# Patient Record
Sex: Female | Born: 1980
Health system: Southern US, Community
[De-identification: ages and names within clinical notes are randomized; demographics above are authoritative.]

## PROBLEM LIST (undated history)

## (undated) DIAGNOSIS — I10 Essential (primary) hypertension: Secondary | ICD-10-CM

## (undated) DIAGNOSIS — IMO0002 Reserved for concepts with insufficient information to code with codable children: Secondary | ICD-10-CM

## (undated) DIAGNOSIS — G5603 Carpal tunnel syndrome, bilateral upper limbs: Secondary | ICD-10-CM

## (undated) DIAGNOSIS — Z9889 Other specified postprocedural states: Secondary | ICD-10-CM

## (undated) DIAGNOSIS — R112 Nausea with vomiting, unspecified: Secondary | ICD-10-CM

## (undated) HISTORY — PX: OTHER SURGICAL HISTORY: SHX169

## (undated) HISTORY — PX: HIP SURGERY: SHX245

---

## 2008-11-22 ENCOUNTER — Emergency Department (HOSPITAL_BASED_OUTPATIENT_CLINIC_OR_DEPARTMENT_OTHER): Admission: EM | Admit: 2008-11-22 | Discharge: 2008-11-22 | Payer: Self-pay | Admitting: Emergency Medicine

## 2009-03-24 ENCOUNTER — Emergency Department (HOSPITAL_BASED_OUTPATIENT_CLINIC_OR_DEPARTMENT_OTHER): Admission: EM | Admit: 2009-03-24 | Discharge: 2009-03-24 | Payer: Self-pay | Admitting: Emergency Medicine

## 2009-05-13 ENCOUNTER — Emergency Department (HOSPITAL_BASED_OUTPATIENT_CLINIC_OR_DEPARTMENT_OTHER): Admission: EM | Admit: 2009-05-13 | Discharge: 2009-05-13 | Payer: Self-pay | Admitting: Emergency Medicine

## 2010-04-18 LAB — PREGNANCY, URINE: Preg Test, Ur: POSITIVE

## 2010-04-18 LAB — URINALYSIS, ROUTINE W REFLEX MICROSCOPIC
Nitrite: NEGATIVE
Protein, ur: NEGATIVE mg/dL
Urobilinogen, UA: 0.2 mg/dL (ref 0.0–1.0)

## 2010-04-18 LAB — URINE MICROSCOPIC-ADD ON

## 2010-08-24 ENCOUNTER — Emergency Department (HOSPITAL_BASED_OUTPATIENT_CLINIC_OR_DEPARTMENT_OTHER)
Admission: EM | Admit: 2010-08-24 | Discharge: 2010-08-24 | Disposition: A | Discharge status: Home / Self Care | Payer: Medicaid Other | Attending: Emergency Medicine | Admitting: Emergency Medicine

## 2010-08-24 DIAGNOSIS — I10 Essential (primary) hypertension: Secondary | ICD-10-CM | POA: Insufficient documentation

## 2010-08-24 DIAGNOSIS — J029 Acute pharyngitis, unspecified: Secondary | ICD-10-CM | POA: Insufficient documentation

## 2010-08-24 DIAGNOSIS — R059 Cough, unspecified: Secondary | ICD-10-CM | POA: Insufficient documentation

## 2010-08-24 DIAGNOSIS — R05 Cough: Secondary | ICD-10-CM | POA: Insufficient documentation

## 2010-08-24 DIAGNOSIS — J069 Acute upper respiratory infection, unspecified: Secondary | ICD-10-CM | POA: Insufficient documentation

## 2010-08-24 HISTORY — DX: Essential (primary) hypertension: I10

## 2010-08-24 LAB — RAPID STREP SCREEN (MED CTR MEBANE ONLY): Streptococcus, Group A Screen (Direct): NEGATIVE

## 2010-08-24 MED ORDER — FEXOFENADINE HCL 180 MG PO TABS: 180.0000 mg | ORAL_TABLET | Freq: Every day | ORAL | Status: DC

## 2010-08-24 MED ORDER — IBUPROFEN 800 MG PO TABS
800.0000 mg | ORAL_TABLET | Freq: Once | ORAL | Status: AC
  Administered 2010-08-24: 800 mg via ORAL
  Filled 2010-08-24: qty 1

## 2010-08-24 MED ORDER — PROMETHAZINE-DM 6.25-15 MG/5ML PO SYRP: ORAL_SOLUTION | ORAL | Status: DC

## 2010-08-24 NOTE — ED Provider Notes (Signed)
History     CSN: [id number] Arrival date & time: 08/24/2010  3:44 PM  Chief Complaint  Patient presents with  . Cough  . Sore Throat   Patient is a 30 y.o. female presenting with cough and pharyngitis. The history is provided by the patient.  Cough This is a new problem. The current episode started more than 2 days ago. The problem occurs constantly. The problem has been gradually worsening. The cough is productive of sputum. The maximum temperature recorded prior to her arrival was 100 to 100.9 F. Pertinent negatives include no chest pain, no shortness of breath and no wheezing. She is a smoker. Her past medical history is significant for bronchitis and pneumonia. Her past medical history does not include COPD, emphysema or asthma.  Sore Throat Associated symptoms include coughing. Pertinent negatives include no abdominal pain, arthralgias, chest pain, fever, neck pain or rash.    Past Medical History  Diagnosis Date  . Hypertension     Past Surgical History  Procedure Date  . Cesarean section     History reviewed. No pertinent family history.  History  Substance Use Topics  . Smoking status: Current Everyday Smoker -- 0.5 packs/day  . Smokeless tobacco: Not on file  . Alcohol Use: No    OB History    Grav Para Term Preterm Abortions TAB SAB Ect Mult Living                  Review of Systems  Constitutional: Negative for fever and activity change.       All ROS Neg except as noted in HPI  HENT: Negative for nosebleeds and neck pain.   Eyes: Negative for photophobia and discharge.  Respiratory: Positive for cough. Negative for shortness of breath and wheezing.   Cardiovascular: Negative for chest pain and palpitations.  Gastrointestinal: Negative for abdominal pain and blood in stool.  Genitourinary: Negative for dysuria, frequency and hematuria.  Musculoskeletal: Negative for back pain and arthralgias.  Skin: Negative.  Negative for rash.  Neurological: Negative  for dizziness, seizures and speech difficulty.  Psychiatric/Behavioral: Negative for hallucinations and confusion.    Physical Exam  BP 149/108  Pulse 103  Temp(Src) 99.5 F (37.5 C) (Oral)  Resp 16  Ht 5\' 4"  (1.626 m)  Wt 179 lb (81.194 kg)  BMI 30.73 kg/m2  SpO2 99%  LMP 07/27/2010  Physical Exam  Nursing note and vitals reviewed. Constitutional: She is oriented to person, place, and time. She appears well-developed and well-nourished.  Non-toxic appearance.  HENT:  Head: Normocephalic.  Right Ear: Tympanic membrane and external ear normal.  Left Ear: Tympanic membrane and external ear normal.  Eyes: EOM and lids are normal. Pupils are equal, round, and reactive to light.  Neck: Normal range of motion. Neck supple. Carotid bruit is not present. No tracheal deviation present.       Increase redness of the pharynx.  Cardiovascular: Normal rate, regular rhythm, normal heart sounds, intact distal pulses and normal pulses.   Pulmonary/Chest: Breath sounds normal. No respiratory distress.       Course breath sounds with mild congestion. No wheeze.  Abdominal: Soft. Bowel sounds are normal. There is no tenderness. There is no guarding.  Musculoskeletal: Normal range of motion.  Lymphadenopathy:       Head (right side): No submandibular adenopathy present.       Head (left side): No submandibular adenopathy present.    She has no cervical adenopathy.  Neurological: She is alert and  oriented to person, place, and time. She has normal strength. No cranial nerve deficit or sensory deficit.  Skin: Skin is warm and dry. No rash noted.  Psychiatric: She has a normal mood and affect. Her speech is normal.    ED Course  Procedures  MDM I have reviewed nursing notes, vital signs, and all appropriate lab and imaging results for this patient.      Kathie Dike, [place] 08/24/10 1755

## 2010-08-24 NOTE — ED Notes (Signed)
Sore throat and cough x 3-4 days.

## 2010-08-25 NOTE — ED Provider Notes (Addendum)
Medical screening examination/treatment/procedure(s) were performed by non-physician practitioner and as supervising physician I was immediately available for consultation/collaboration.  Cyndra Numbers, MD 08/25/10 0001  Cyndra Numbers, MD 09/21/10 [phone]

## 2010-11-20 ENCOUNTER — Encounter (HOSPITAL_BASED_OUTPATIENT_CLINIC_OR_DEPARTMENT_OTHER): Payer: Self-pay

## 2010-11-20 ENCOUNTER — Emergency Department (HOSPITAL_BASED_OUTPATIENT_CLINIC_OR_DEPARTMENT_OTHER)
Admission: EM | Admit: 2010-11-20 | Discharge: 2010-11-20 | Disposition: A | Discharge status: Home / Self Care | Payer: Medicaid Other | Attending: Emergency Medicine | Admitting: Emergency Medicine

## 2010-11-20 DIAGNOSIS — I10 Essential (primary) hypertension: Secondary | ICD-10-CM | POA: Insufficient documentation

## 2010-11-20 DIAGNOSIS — F17200 Nicotine dependence, unspecified, uncomplicated: Secondary | ICD-10-CM | POA: Insufficient documentation

## 2010-11-20 DIAGNOSIS — B360 Pityriasis versicolor: Secondary | ICD-10-CM | POA: Insufficient documentation

## 2010-11-20 DIAGNOSIS — R21 Rash and other nonspecific skin eruption: Secondary | ICD-10-CM | POA: Insufficient documentation

## 2010-11-20 NOTE — ED Notes (Signed)
Rash x weeks-daughter was dx/tx for scabies-pt used same tx-reports no better

## 2010-11-20 NOTE — ED Provider Notes (Signed)
History     CSN: [id number] Arrival date & time: 11/20/2010 12:10 PM   First MD Initiated Contact with Patient 11/20/10 1204      Chief Complaint  Patient presents with  . Rash    (Consider location/radiation/quality/duration/timing/severity/associated sxs/prior treatment) HPI Comments: Pt states that she has tried elemite cream since her daughter was diagnosed by scabies  Patient is a 30 y.o. female presenting with rash. The history is provided by the patient.  Rash  This is a new problem. The current episode started more than 1 week ago. The problem has not changed since onset.The problem is associated with an unknown factor. There has been no fever. The rash is present on the trunk. The patient is experiencing no pain. The pain has been constant since onset. Pertinent negatives include no itching and no weeping.    Past Medical History  Diagnosis Date  . Hypertension     Past Surgical History  Procedure Date  . Cesarean section     No family history on file.  History  Substance Use Topics  . Smoking status: Current Everyday Smoker -- 0.5 packs/day  . Smokeless tobacco: Not on file  . Alcohol Use: No    OB History    Grav Para Term Preterm Abortions TAB SAB Ect Mult Living                  Review of Systems  Skin: Positive for rash. Negative for itching.  All other systems reviewed and are negative.    Allergies  Penicillins  Home Medications   Current Outpatient Rx  Name Route Sig Dispense Refill  . FEXOFENADINE HCL 180 MG PO TABS Oral Take 1 tablet (180 mg total) by mouth daily. 10 tablet 0  . PROMETHAZINE-DM 6.25-15 MG/5ML PO SYRP  5ml po q6h prn cough 120 mL 0    BP 134/70  Pulse 72  Temp(Src) 97.9 F (36.6 C) (Oral)  Resp 16  Ht 5\' 4"  (1.626 m)  Wt 175 lb (79.379 kg)  BMI 30.04 kg/m2  SpO2 100%  LMP 11/17/2010  Physical Exam  Nursing note and vitals reviewed. Constitutional: She is oriented to person, place, and time. She appears  well-developed and well-nourished.  Cardiovascular: Normal rate and regular rhythm.   Pulmonary/Chest: Effort normal and breath sounds normal.  Neurological: She is alert and oriented to person, place, and time.  Skin:       Pt has skin discoloration in patches to the trunk  Psychiatric: She has a normal mood and affect.    ED Course  Procedures (including critical care time)  Labs Reviewed - No data to display No results found.   1. Tinea versicolor       MDM  Discussed treat with dandruff shampoo with pt    Medical screening examination/treatment/procedure(s) were performed by non-physician practitioner and as supervising physician I was immediately available for consultation/collaboration. Osvaldo Human, M.D.   Teressa Lower, NP 11/20/10 1236  Carleene Cooper III, MD 11/20/10 [phone]

## 2011-02-27 ENCOUNTER — Encounter (HOSPITAL_BASED_OUTPATIENT_CLINIC_OR_DEPARTMENT_OTHER): Payer: Self-pay | Admitting: *Deleted

## 2011-02-27 ENCOUNTER — Emergency Department (HOSPITAL_BASED_OUTPATIENT_CLINIC_OR_DEPARTMENT_OTHER)
Admission: EM | Admit: 2011-02-27 | Discharge: 2011-02-27 | Disposition: A | Discharge status: Home / Self Care | Payer: Self-pay | Attending: Emergency Medicine | Admitting: Emergency Medicine

## 2011-02-27 DIAGNOSIS — Y92009 Unspecified place in unspecified non-institutional (private) residence as the place of occurrence of the external cause: Secondary | ICD-10-CM | POA: Insufficient documentation

## 2011-02-27 DIAGNOSIS — S058X9A Other injuries of unspecified eye and orbit, initial encounter: Secondary | ICD-10-CM

## 2011-02-27 DIAGNOSIS — X58XXXA Exposure to other specified factors, initial encounter: Secondary | ICD-10-CM | POA: Insufficient documentation

## 2011-02-27 DIAGNOSIS — I10 Essential (primary) hypertension: Secondary | ICD-10-CM | POA: Insufficient documentation

## 2011-02-27 DIAGNOSIS — S0500XA Injury of conjunctiva and corneal abrasion without foreign body, unspecified eye, initial encounter: Secondary | ICD-10-CM | POA: Insufficient documentation

## 2011-02-27 MED ORDER — FLUORESCEIN SODIUM 1 MG OP STRP
1.0000 | ORAL_STRIP | Freq: Once | OPHTHALMIC | Status: AC
  Administered 2011-02-27: 09:00:00 via OPHTHALMIC
  Filled 2011-02-27: qty 1

## 2011-02-27 MED ORDER — TETRACAINE HCL 0.5 % OP SOLN
1.0000 [drp] | Freq: Once | OPHTHALMIC | Status: AC
  Administered 2011-02-27: 1 [drp] via OPHTHALMIC
  Filled 2011-02-27: qty 2

## 2011-02-27 MED ORDER — CIPROFLOXACIN HCL 0.3 % OP SOLN
1.0000 [drp] | OPHTHALMIC | Status: DC
  Administered 2011-02-27: 1 [drp] via OPHTHALMIC
  Filled 2011-02-27: qty 2.5

## 2011-02-27 MED ORDER — TETANUS-DIPHTH-ACELL PERTUSSIS 5-2.5-18.5 LF-MCG/0.5 IM SUSP
0.5000 mL | Freq: Once | INTRAMUSCULAR | Status: AC
  Administered 2011-02-27: 0.5 mL via INTRAMUSCULAR
  Filled 2011-02-27: qty 0.5

## 2011-02-27 NOTE — ED Provider Notes (Signed)
History     CSN: [id number]  Arrival date & time 02/27/11  4098   First MD Initiated Contact with Patient 02/27/11 [phone]      Chief Complaint  Patient presents with  . Eye Pain    right   patient states she woke this morning. The right eye pain. It was somewhat red and watery. However, there was no discharge or drainage. There is no crusting. Patient denied any decrease in visual acuity. He does not remember injuring her eye. She's had no headache or vomiting. No dizziness or syncope  (Consider location/radiation/quality/duration/timing/severity/associated sxs/prior treatment) HPI  Past Medical History  Diagnosis Date  . Hypertension     Past Surgical History  Procedure Date  . Cesarean section     No family history on file.  History  Substance Use Topics  . Smoking status: Current Everyday Smoker -- 0.5 packs/day  . Smokeless tobacco: Not on file  . Alcohol Use: No    OB History    Grav Para Term Preterm Abortions TAB SAB Ect Mult Living                  Review of Systems  All other systems reviewed and are negative.    Allergies  Penicillins  Home Medications   Current Outpatient Rx  Name Route Sig Dispense Refill  . FEXOFENADINE HCL 180 MG PO TABS Oral Take 1 tablet (180 mg total) by mouth daily. 10 tablet 0  . PROMETHAZINE-DM 6.25-15 MG/5ML PO SYRP  5ml po q6h prn cough 120 mL 0    There were no vitals taken for this visit.  Physical Exam  Nursing note and vitals reviewed. Constitutional: She appears well-developed and well-nourished. No distress.  HENT:  Head: Normocephalic.  Eyes: Pupils are equal, round, and reactive to light.  Cardiovascular: Normal heart sounds.   Pulmonary/Chest: Breath sounds normal.  Abdominal: Soft.  Musculoskeletal: Normal range of motion.  Neurological: She is alert.  Skin: Skin is warm and dry.    ED Course  Procedures (including critical care time)  Labs Reviewed - No data to display No results  found.   No diagnosis found.    MDM  Patient is seen and examined, initial history and physical is completed. Evaluation initiated    Forms. Fluorescein staining in visual acuity and inspect cornea with a black light.    Shows scleral abrasion.  Will treat accordingly.   Meric Joye A. Patrica Duel, MD 03/02/11 [phone]

## 2011-02-27 NOTE — ED Notes (Signed)
Woke up this morning with right eye pain, redness and draining.  Used visine eye drops.

## 2011-02-27 NOTE — Discharge Instructions (Signed)
Eye Injury The eye can be injured by scratches, foreign bodies, contact lenses, very bright light (welding torches), and chemical irritation. The cornea (the clear part of the eye) is very sensitive; even minor injuries to it are painful. Most injuries to the cornea heal in 2-4 days. Treatment may include:  Antibiotic eye drops or ointment may be needed to soothe the eye and prevent infection. Drops that numb the eye (anesthetic drops) should not be used repeatedly as they can delay healing. Drops to dilate the pupil for 1-2 days are sometimes used to relieve pain.   Patching the eye can reduce irritation from blinking and bright light. When your eye is patched, you should not drive or operate machinery because your side vision and your ability to judge distances are decreased.   Rest your eye. Stay in a darkened room and wear sunglasses to reduce the irritation from light.   Do not rub your eye for the next 2 weeks to allow complete healing.  If you have contact lenses, do not wear them until your caregiver says it is safe to do so. Pain medicine may also be needed for 1-2 days.  SEEK MEDICAL CARE IF:   You have increased pain, persistent irritation or blurred vision over the next 2 days.   Your symptoms are getting worse or not improving.   You have any other questions or concerns regarding your injury.  Document Released: 02/08/2004 Document Revised: 09/12/2010 Document Reviewed: 12/31/2004 [facility] Patient Information 2012 [place], [place].

## 2011-07-06 ENCOUNTER — Emergency Department (HOSPITAL_BASED_OUTPATIENT_CLINIC_OR_DEPARTMENT_OTHER)
Admission: EM | Admit: 2011-07-06 | Discharge: 2011-07-06 | Disposition: A | Discharge status: Home / Self Care | Payer: Medicaid Other | Attending: Emergency Medicine | Admitting: Emergency Medicine

## 2011-07-06 ENCOUNTER — Encounter (HOSPITAL_BASED_OUTPATIENT_CLINIC_OR_DEPARTMENT_OTHER): Payer: Self-pay | Admitting: *Deleted

## 2011-07-06 DIAGNOSIS — J069 Acute upper respiratory infection, unspecified: Secondary | ICD-10-CM

## 2011-07-06 DIAGNOSIS — Z79899 Other long term (current) drug therapy: Secondary | ICD-10-CM | POA: Insufficient documentation

## 2011-07-06 DIAGNOSIS — K029 Dental caries, unspecified: Secondary | ICD-10-CM

## 2011-07-06 DIAGNOSIS — F17200 Nicotine dependence, unspecified, uncomplicated: Secondary | ICD-10-CM | POA: Insufficient documentation

## 2011-07-06 MED ORDER — CLINDAMYCIN HCL 150 MG PO CAPS: 300.0000 mg | ORAL_CAPSULE | Freq: Three times a day (TID) | ORAL | Status: AC

## 2011-07-06 MED ORDER — HYDROCODONE-ACETAMINOPHEN 5-500 MG PO TABS: 1.0000 | ORAL_TABLET | Freq: Four times a day (QID) | ORAL | Status: AC | PRN

## 2011-07-06 NOTE — ED Provider Notes (Signed)
History   This chart was scribed for Rolan Bucco, MD by Charolett Bumpers . The patient was seen in room MH08/MH08.    CSN: [id number]  Arrival date & time 07/06/11  1633   First MD Initiated Contact with Patient 07/06/11 1652      Chief Complaint  Patient presents with  . Nasal Congestion    (Consider location/radiation/quality/duration/timing/severity/associated sxs/prior treatment) HPI Erin Thomas is a 31 y.o. female who presents to the Emergency Department complaining of constant, moderate front dental pain for the past 2 days. Patient reports associated swelling and pain to touch. Patient denies any fevers. Patient denies any drainage. Patient also reports an associated cough, congestion, rhinorrhea and sore throat. Patient denies any chest pain, SOB, n/v/d, abdominal pain or rashes. Patient states that she has taken Tylenol at home for her symptoms with no relief. Patient states that her LNMP was 01/28/11. Patient states that she is 5 months pregnant. Patient states that she feels fetal movement normally. Patient states that she has an appointment next week with her OB/GYN.  No vaginal bleeding, discharge, or abd cramping  OB/GYN: in [facility]  Past Medical History  Diagnosis Date  . Hypertension     Past Surgical History  Procedure Date  . Cesarean section   . Hip surgery     left hip pinning from slipped disk    History reviewed. No pertinent family history.  History  Substance Use Topics  . Smoking status: Current Everyday Smoker -- 0.5 packs/day for 10 years    Types: Cigarettes  . Smokeless tobacco: Not on file  . Alcohol Use: No    OB History    Grav Para Term Preterm Abortions TAB SAB Ect Mult Living   3 2              Review of Systems  Constitutional: Negative for fever.  HENT: Positive for congestion, sore throat, rhinorrhea and dental problem.   Respiratory: Positive for cough. Negative for shortness of breath.   Cardiovascular:  Negative for chest pain.  Gastrointestinal: Negative for nausea, vomiting and abdominal pain.  Genitourinary: Negative for dysuria, vaginal bleeding and vaginal discharge.  Skin: Negative for rash.  All other systems reviewed and are negative.    Allergies  Penicillins  Home Medications   Current Outpatient Rx  Name Route Sig Dispense Refill  . CLINDAMYCIN HCL 150 MG PO CAPS Oral Take 2 capsules (300 mg total) by mouth 3 (three) times daily. 30 capsule 0  . HYDROCODONE-ACETAMINOPHEN 5-500 MG PO TABS Oral Take 1-2 tablets by mouth every 6 (six) hours as needed for pain. 10 tablet 0    BP 132/71  Pulse 103  Temp 98.5 F (36.9 C) (Oral)  Resp 18  Ht 5\' 4"  (1.626 m)  Wt 196 lb (88.905 kg)  BMI 33.64 kg/m2  SpO2 100%  LMP 01/31/2011  Physical Exam  Nursing note and vitals reviewed. Constitutional: She is oriented to person, place, and time. She appears well-developed and well-nourished. No distress.  HENT:  Head: Normocephalic and atraumatic. No trismus in the jaw.  Right Ear: External ear normal.  Left Ear: External ear normal.  Mouth/Throat: Oropharynx is clear and moist and mucous membranes are normal.       TM's normal bilaterally. Pain and swelling to right top medial incisor. No drainage. No induration or fluctuance. No trismus.   Eyes: EOM are normal. Pupils are equal, round, and reactive to light.  Neck: Normal range of motion. Neck supple.  No tracheal deviation present.  Cardiovascular: Normal rate, regular rhythm and normal heart sounds.   Pulmonary/Chest: Effort normal and breath sounds normal. No respiratory distress.  Abdominal: Soft. Bowel sounds are normal. There is no tenderness.  Musculoskeletal: Normal range of motion. She exhibits no edema.  Neurological: She is alert and oriented to person, place, and time. No sensory deficit.  Skin: Skin is warm and dry.  Psychiatric: She has a normal mood and affect. Her behavior is normal.    ED Course  Procedures  (including critical care time)  DIAGNOSTIC STUDIES: Oxygen Saturation is 100% on room air, normal by my interpretation.    COORDINATION OF CARE:  1713: Discussed planned course of treatment with the patient, who is agreeable at this time. Discussed starting on abx and f/u with a dentist.     Labs Reviewed - No data to display No results found.   1. Dental caries   2. URI (upper respiratory infection)       MDM  Pt with primary complaints of dental pain, with signs of dental infection.  No abscess palpated.  No signs of pneumonia.  Non-toxic appearing.  Has appt on Tuesday with ob/gyn.  Will start abx, small dose of pain meds  I personally performed the services described in this documentation, which was scribed in my presence.  The recorded information has been reviewed and considered.        Rolan Bucco, MD 07/06/11 [phone]

## 2011-07-06 NOTE — ED Notes (Signed)
Pt states she is 5 months pregnant and has been congested for 3 days.

## 2011-07-06 NOTE — Discharge Instructions (Signed)

## 2011-09-27 ENCOUNTER — Encounter (HOSPITAL_BASED_OUTPATIENT_CLINIC_OR_DEPARTMENT_OTHER): Payer: Self-pay | Admitting: Emergency Medicine

## 2011-09-27 ENCOUNTER — Emergency Department (HOSPITAL_BASED_OUTPATIENT_CLINIC_OR_DEPARTMENT_OTHER)
Admission: EM | Admit: 2011-09-27 | Discharge: 2011-09-27 | Disposition: A | Discharge status: Home / Self Care | Payer: Medicaid Other | Attending: Emergency Medicine | Admitting: Emergency Medicine

## 2011-09-27 DIAGNOSIS — O139 Gestational [pregnancy-induced] hypertension without significant proteinuria, unspecified trimester: Secondary | ICD-10-CM | POA: Insufficient documentation

## 2011-09-27 DIAGNOSIS — Z880 Allergy status to penicillin: Secondary | ICD-10-CM | POA: Insufficient documentation

## 2011-09-27 DIAGNOSIS — O99330 Smoking (tobacco) complicating pregnancy, unspecified trimester: Secondary | ICD-10-CM | POA: Insufficient documentation

## 2011-09-27 DIAGNOSIS — Z0389 Encounter for observation for other suspected diseases and conditions ruled out: Secondary | ICD-10-CM | POA: Insufficient documentation

## 2011-09-27 DIAGNOSIS — Z3490 Encounter for supervision of normal pregnancy, unspecified, unspecified trimester: Secondary | ICD-10-CM

## 2011-09-27 LAB — CBC WITH DIFFERENTIAL/PLATELET
Hemoglobin: 11 g/dL — ABNORMAL LOW (ref 12.0–15.0)
Lymphs Abs: 1.8 10*3/uL (ref 0.7–4.0)
Monocytes Relative: 7 % (ref 3–12)
Neutro Abs: 5.6 10*3/uL (ref 1.7–7.7)
Neutrophils Relative %: 70 % (ref 43–77)
RBC: 3.59 MIL/uL — ABNORMAL LOW (ref 3.87–5.11)

## 2011-09-27 LAB — COMPREHENSIVE METABOLIC PANEL
Albumin: 2.6 g/dL — ABNORMAL LOW (ref 3.5–5.2)
Alkaline Phosphatase: 88 U/L (ref 39–117)
BUN: 4 mg/dL — ABNORMAL LOW (ref 6–23)
CO2: 19 mEq/L (ref 19–32)
Chloride: 102 mEq/L (ref 96–112)
GFR calc Af Amer: >90 mL/min (ref >90)
Glucose, Bld: 94 mg/dL (ref 70–99)
Potassium: 3.7 mEq/L (ref 3.5–5.1)
Total Bilirubin: 0.1 mg/dL — ABNORMAL LOW (ref 0.3–1.2)

## 2011-09-27 LAB — URINALYSIS, ROUTINE W REFLEX MICROSCOPIC
Bilirubin Urine: NEGATIVE
Glucose, UA: NEGATIVE mg/dL
Leukocytes, UA: NEGATIVE
Nitrite: NEGATIVE
Specific Gravity, Urine: 1.011 (ref 1.005–1.030)
pH: 7 (ref 5.0–8.0)

## 2011-09-27 LAB — URINE MICROSCOPIC-ADD ON

## 2011-09-27 NOTE — ED Notes (Signed)
Jasmine December RN at [facility] notified me of patient arrival.  Chief complaint of hypertension No c/o labor symptoms EFM applied and will assess

## 2011-09-27 NOTE — ED Provider Notes (Signed)
History     CSN: [id number]  Arrival date & time 09/27/11  1254   First MD Initiated Contact with Patient 09/27/11 1312      Chief Complaint  Patient presents with  . Hypertension    (Consider location/radiation/quality/duration/timing/severity/associated sxs/prior treatment) Patient is a 31 y.o. female presenting with hypertension. The history is provided by the patient. No language interpreter was used.  Hypertension This is a new problem. The current episode started today. The problem occurs constantly. The problem has been unchanged. Pertinent negatives include no abdominal pain, joint swelling, nausea or vomiting. Nothing aggravates the symptoms. She has tried nothing for the symptoms. The treatment provided no relief.   Pt complains of high blood pressure.  Pt reports she has had elevated bp with previous pregnancy's.   Pt is a G3.   Pt reports she called Dr. Shawnie Pons who advised her to come to ED to get blood pressure checked.   Past Medical History  Diagnosis Date  . Hypertension     Past Surgical History  Procedure Date  . Cesarean section   . Hip surgery     left hip pinning from slipped disk    No family history on file.  History  Substance Use Topics  . Smoking status: Current Every Day Smoker -- 0.5 packs/day for 10 years    Types: Cigarettes  . Smokeless tobacco: Not on file  . Alcohol Use: No    OB History    Grav Para Term Preterm Abortions TAB SAB Ect Mult Living   3 2              Review of Systems  Gastrointestinal: Negative for nausea, vomiting and abdominal pain.  Musculoskeletal: Negative for joint swelling.  All other systems reviewed and are negative.    Allergies  Penicillins  Home Medications  No current outpatient prescriptions on file.  BP 119/72  Pulse 90  Temp 98.4 F (36.9 C) (Oral)  Resp 18  SpO2 100%  LMP 01/31/2011  Physical Exam  Nursing note and vitals reviewed. Constitutional: She is oriented to person, place, and  time. She appears well-developed and well-nourished.  HENT:  Head: Normocephalic and atraumatic.  Right Ear: External ear normal.  Left Ear: External ear normal.  Nose: Nose normal.  Mouth/Throat: Oropharynx is clear and moist.  Eyes: Conjunctivae normal and EOM are normal. Pupils are equal, round, and reactive to light.  Neck: Normal range of motion. Neck supple.  Cardiovascular: Normal rate, regular rhythm and normal heart sounds.   Pulmonary/Chest: Effort normal and breath sounds normal.  Abdominal: Soft. Bowel sounds are normal.  Musculoskeletal: Normal range of motion.  Neurological: She is alert and oriented to person, place, and time. She has normal reflexes.  Skin: Skin is warm.  Psychiatric: She has a normal mood and affect.    ED Course  Procedures (including critical care time)  Labs Reviewed  URINALYSIS, ROUTINE W REFLEX MICROSCOPIC - Abnormal; Notable for the following:    APPearance CLOUDY (*)     Hgb urine dipstick SMALL (*)     All other components within normal limits  CBC WITH DIFFERENTIAL - Abnormal; Notable for the following:    RBC 3.59 (*)     Hemoglobin 11.0 (*)     HCT 32.3 (*)     All other components within normal limits  COMPREHENSIVE METABOLIC PANEL - Abnormal; Notable for the following:    Sodium 134 (*)     BUN 4 (*)  Creatinine, Ser 0.40 (*)     Albumin 2.6 (*)     Total Bilirubin 0.1 (*)     All other components within normal limits  URINE MICROSCOPIC-ADD ON - Abnormal; Notable for the following:    Squamous Epithelial / LPF FEW (*)     Bacteria, UA MANY (*)     All other components within normal limits   No results found.   1. Pregnancy       MDM  Labs normal.  dtr's normal,  Urine no protein,  Labs normal.   I advised Dr. Shawnie Pons of findings.   Pt advised to go to [facility] if any problems.        Lonia Skinner [place], [place] 09/27/11 1512

## 2011-09-27 NOTE — ED Notes (Signed)
Erin December RN at [facility] updated me on patient status Patient lab work WNL Patient to be discharged External monitors taken off Fetal strip reactive and no noted contractions per self

## 2011-09-27 NOTE — ED Notes (Signed)
Pt states BP has been high today. Pt states she has taken meds as prescribed. No other symptoms

## 2011-09-27 NOTE — ED Notes (Signed)
Ice water given. Pt has no complaints.

## 2011-09-27 NOTE — ED Provider Notes (Signed)
Medical screening examination/treatment/procedure(s) were performed by non-physician practitioner and as supervising physician I was immediately available for consultation/collaboration.   Tahirih Lair, MD 09/27/11 1528 

## 2011-09-27 NOTE — ED Notes (Signed)
No contractions per Selena Batten RN rapid response [facility]

## 2013-11-15 ENCOUNTER — Encounter (HOSPITAL_BASED_OUTPATIENT_CLINIC_OR_DEPARTMENT_OTHER): Payer: Self-pay | Admitting: Emergency Medicine

## 2014-06-28 ENCOUNTER — Encounter (HOSPITAL_BASED_OUTPATIENT_CLINIC_OR_DEPARTMENT_OTHER): Payer: Self-pay | Admitting: *Deleted

## 2014-06-28 ENCOUNTER — Emergency Department (HOSPITAL_BASED_OUTPATIENT_CLINIC_OR_DEPARTMENT_OTHER)
Admission: EM | Admit: 2014-06-28 | Discharge: 2014-06-28 | Disposition: A | Discharge status: Home / Self Care | Payer: 59 | Attending: Emergency Medicine | Admitting: Emergency Medicine

## 2014-06-28 DIAGNOSIS — Z720 Tobacco use: Secondary | ICD-10-CM | POA: Diagnosis not present

## 2014-06-28 DIAGNOSIS — Z880 Allergy status to penicillin: Secondary | ICD-10-CM | POA: Insufficient documentation

## 2014-06-28 DIAGNOSIS — I10 Essential (primary) hypertension: Secondary | ICD-10-CM | POA: Insufficient documentation

## 2014-06-28 DIAGNOSIS — M545 Low back pain: Secondary | ICD-10-CM | POA: Insufficient documentation

## 2014-06-28 MED ORDER — NAPROXEN 500 MG PO TABS
500.0000 mg | ORAL_TABLET | Freq: Two times a day (BID) | ORAL | Status: DC
Start: 2014-06-28 — End: 2015-06-18

## 2014-06-28 MED ORDER — CYCLOBENZAPRINE HCL 5 MG PO TABS: 5.0000 mg | ORAL_TABLET | Freq: Three times a day (TID) | ORAL | Status: DC | PRN

## 2014-06-28 NOTE — Discharge Instructions (Signed)
Back Injury Prevention Back injuries can be extremely painful and difficult to heal. After having one back injury, you are much more likely to experience another later on. It is important to learn how to avoid injuring or re-injuring your back. The following tips can help you to prevent a back injury. PHYSICAL FITNESS  Exercise regularly and try to develop good tone in your abdominal muscles. Your abdominal muscles provide a lot of the support needed by your back.  Do aerobic exercises (walking, jogging, biking, swimming) regularly.  Do exercises that increase balance and strength (tai chi, yoga) regularly. This can decrease your risk of falling and injuring your back.  Stretch before and after exercising.  Maintain a healthy weight. The more you weigh, the more stress is placed on your back. For every pound of weight, 10 times that amount of pressure is placed on the back. DIET  Talk to your caregiver about how much calcium and vitamin D you need per day. These nutrients help to prevent weakening of the bones (osteoporosis). Osteoporosis can cause broken (fractured) bones that lead to back pain.  Include good sources of calcium in your diet, such as dairy products, green, leafy vegetables, and products with calcium added (fortified).  Include good sources of vitamin D in your diet, such as milk and foods that are fortified with vitamin D.  Consider taking a nutritional supplement or a multivitamin if needed.  Stop smoking if you smoke. POSTURE  Sit and stand up straight. Avoid leaning forward when you sit or hunching over when you stand.  Choose chairs with good low back (lumbar) support.  If you work at a desk, sit close to your work so you do not need to lean over. Keep your chin tucked in. Keep your neck drawn back and elbows bent at a right angle. Your arms should look like the letter "L."  Sit high and close to the steering wheel when you drive. Add a lumbar support to your car  seat if needed.  Avoid sitting or standing in one position for too long. Take breaks to get up, stretch, and walk around at least once every hour. Take breaks if you are driving for long periods of time.  Sleep on your side with your knees slightly bent, or sleep on your back with a pillow under your knees. Do not sleep on your stomach. LIFTING, TWISTING, AND REACHING  Avoid heavy lifting, especially repetitive lifting. If you must do heavy lifting:  Stretch before lifting.  Work slowly.  Rest between lifts.  Use carts and dollies to move objects when possible.  Make several small trips instead of carrying 1 heavy load.  Ask for help when you need it.  Ask for help when moving big, awkward objects.  Follow these steps when lifting:  Stand with your feet shoulder-width apart.  Get as close to the object as you can. Do not try to pick up heavy objects that are far from your body.  Use handles or lifting straps if they are available.  Bend at your knees. Squat down, but keep your heels off the floor.  Keep your shoulders pulled back, your chin tucked in, and your back straight.  Lift the object slowly, tightening the muscles in your legs, abdomen, and buttocks. Keep the object as close to the center of your body as possible.  When you put a load down, use these same guidelines in reverse.  Do not:  Lift the object above your waist.  Twist at the waist while lifting or carrying a load. Move your feet if you need to turn, not your waist.  Bend over without bending at your knees.  Avoid reaching over your head, across a table, or for an object on a high surface. OTHER TIPS  Avoid wet floors and keep sidewalks clear of ice to prevent falls.  Do not sleep on a mattress that is too soft or too hard.  Keep items that are used frequently within easy reach.  Put heavier objects on shelves at waist level and lighter objects on lower or higher shelves.  Find ways to  decrease your stress, such as exercise, massage, or relaxation techniques. Stress can build up in your muscles. Tense muscles are more vulnerable to injury.  Seek treatment for depression or anxiety if needed. These conditions can increase your risk of developing back pain. SEEK MEDICAL CARE IF:  You injure your back.  You have questions about diet, exercise, or other ways to prevent back injuries. MAKE SURE YOU:  Understand these instructions.  Will watch your condition.  Will get help right away if you are not doing well or get worse. Document Released: 02/08/2004 Document Revised: 03/25/2011 Document Reviewed: 02/11/2011 ExitCare Patient Information 2015 [employer]. This information is not intended to replace advice given to you by your health care provider. Make sure you discuss any questions you have with your health care provider.  

## 2014-06-28 NOTE — ED Notes (Signed)
Pt sts she was moving this weekend and this morning, after getting off of work, she developed a sharp pain in her bilat lower back. Pt denies dysuria.

## 2014-06-28 NOTE — ED Provider Notes (Signed)
CSN: [id number]     Arrival date & time 06/28/14  1008 History   First MD Initiated Contact with Patient 06/28/14 1044     Chief Complaint  Patient presents with  . Back Pain     (Consider location/radiation/quality/duration/timing/severity/associated sxs/prior Treatment) Patient is a 34 y.o. female presenting with back pain. The history is provided by the patient.  Back Pain Location:  Lumbar spine Radiates to:  Does not radiate Pain severity:  Moderate Onset quality:  Sudden Timing:  Constant Chronicity:  New Relieved by:  Heating pad Worsened by:  Movement Associated symptoms: no chest pain, no fever, no numbness and no weakness     Erin Thomas is a 34 yo F p/w with back pain. The pain started this morning when she got off of work. She works as a Water quality scientist at [employer]. Pain is described as sharp and 9 out of 10. She denies any prior injury to her back and is no radiation down her leg. There is no weakness or tingling in her lower extremities. The pain was improved with a heating pad and rest and worse with any kind of movement. She denies any chest pain, shortness of breath, changes in bowel movements, urinary incontinence, rash, fever, chills or night sweats.  Past Medical History  Diagnosis Date  . Hypertension    Past Surgical History  Procedure Laterality Date  . Cesarean section    . Hip surgery      left hip pinning from slipped disk  . Tubal ligation     No family history on file. History  Substance Use Topics  . Smoking status: Current Every Day Smoker -- 0.50 packs/day for 10 years    Types: Cigarettes  . Smokeless tobacco: Not on file  . Alcohol Use: No   OB History    Gravida Para Term Preterm AB TAB SAB Ectopic Multiple Living   3 2             Review of Systems  Constitutional: Negative for fever.  Respiratory: Negative for shortness of breath.   Cardiovascular: Negative for chest pain.  Gastrointestinal: Negative for nausea, vomiting, diarrhea  and constipation.  Musculoskeletal: Positive for back pain.  Skin: Negative for rash.  Neurological: Negative for weakness and numbness.      Allergies  Penicillins  Home Medications   Prior to Admission medications   Medication Sig Start Date End Date Taking? Authorizing Provider  cyclobenzaprine (FLEXERIL) 5 MG tablet Take 1 tablet (5 mg total) by mouth 3 (three) times daily as needed for muscle spasms. 06/28/14   Myra Rude, MD  naproxen (NAPROSYN) 500 MG tablet Take 1 tablet (500 mg total) by mouth 2 (two) times daily with a meal. 06/28/14   Myra Rude, MD   BP 145/81 mmHg  Pulse 86  Temp(Src) 97.6 F (36.4 C) (Oral)  Resp 18  Ht  (1.626 m)  Wt 218 lb (98.884 kg)  BMI 37.40 kg/m2  SpO2 100%  LMP 04/28/2014  Breastfeeding? Unknown Physical Exam  Constitutional: She is oriented to person, place, and time. She appears well-developed and well-nourished.  HENT:  Head: Normocephalic and atraumatic.  Eyes: Conjunctivae and EOM are normal.  Neck: Normal range of motion.  Cardiovascular: Normal rate.   Pulmonary/Chest: Effort normal.  Musculoskeletal:  MSK:  Back: No erythema, ecchymosis, No step-offs or deformities Tender to palpation left lumbar Normal back extension Pain exacerbated with flexion. Normal right hip internal and external rotation Limited internal rotation  of left hip normal external Normal flexion of the hip bilaterally Neurovascularly intact distally Normal plantar and dorsiflexion Negative straight leg raise bilaterally  Neurological: She is alert and oriented to person, place, and time.  Skin: Skin is warm. No rash noted.    ED Course  Procedures (including critical care time) Labs Review Labs Reviewed - No data to display  Imaging Review No results found.   EKG Interpretation None      MDM   Final diagnoses:  Left low back pain, with sciatica presence unspecified   Erin Thomas p/w with low back pain. Most likely a  muscle spasm in nature. Will give naproxen and flexeril. Will encouraged home modalities and f/u with primary doctor if pain persists for 4 weeks. No red flags on exam. Patient agreeable with plan and discharge.   Myra Rude, MD PGY-2, [facility] 06/28/2014, 11:47 AM      Myra Rude, MD 06/28/14 1147  Tilden Fossa, MD 06/29/14 [phone]

## 2015-06-18 ENCOUNTER — Encounter (HOSPITAL_BASED_OUTPATIENT_CLINIC_OR_DEPARTMENT_OTHER): Payer: Self-pay | Admitting: *Deleted

## 2015-06-18 ENCOUNTER — Emergency Department (HOSPITAL_BASED_OUTPATIENT_CLINIC_OR_DEPARTMENT_OTHER)
Admission: EM | Admit: 2015-06-18 | Discharge: 2015-06-18 | Disposition: A | Discharge status: Home / Self Care | Payer: 59 | Attending: Emergency Medicine | Admitting: Emergency Medicine

## 2015-06-18 DIAGNOSIS — X58XXXA Exposure to other specified factors, initial encounter: Secondary | ICD-10-CM | POA: Insufficient documentation

## 2015-06-18 DIAGNOSIS — Y999 Unspecified external cause status: Secondary | ICD-10-CM | POA: Diagnosis not present

## 2015-06-18 DIAGNOSIS — Y929 Unspecified place or not applicable: Secondary | ICD-10-CM | POA: Insufficient documentation

## 2015-06-18 DIAGNOSIS — F17210 Nicotine dependence, cigarettes, uncomplicated: Secondary | ICD-10-CM | POA: Diagnosis not present

## 2015-06-18 DIAGNOSIS — I10 Essential (primary) hypertension: Secondary | ICD-10-CM | POA: Diagnosis not present

## 2015-06-18 DIAGNOSIS — H579 Unspecified disorder of eye and adnexa: Secondary | ICD-10-CM

## 2015-06-18 DIAGNOSIS — T1581XA Foreign body in other and multiple parts of external eye, right eye, initial encounter: Secondary | ICD-10-CM | POA: Diagnosis not present

## 2015-06-18 DIAGNOSIS — Y939 Activity, unspecified: Secondary | ICD-10-CM | POA: Diagnosis not present

## 2015-06-18 DIAGNOSIS — T1501XA Foreign body in cornea, right eye, initial encounter: Secondary | ICD-10-CM | POA: Diagnosis not present

## 2015-06-18 DIAGNOSIS — H5711 Ocular pain, right eye: Secondary | ICD-10-CM | POA: Diagnosis present

## 2015-06-18 MED ORDER — ERYTHROMYCIN 5 MG/GM OP OINT: 1.0000 "application " | TOPICAL_OINTMENT | Freq: Four times a day (QID) | OPHTHALMIC | Status: DC

## 2015-06-18 MED ORDER — FLUORESCEIN SODIUM 1 MG OP STRP
ORAL_STRIP | OPHTHALMIC | Status: AC
  Administered 2015-06-18: 15:00:00
  Filled 2015-06-18: qty 1

## 2015-06-18 MED ORDER — TETRACAINE HCL 0.5 % OP SOLN
OPHTHALMIC | Status: AC
  Administered 2015-06-18: 15:00:00
  Filled 2015-06-18: qty 4

## 2015-06-18 NOTE — ED Provider Notes (Signed)
CSN: [id number]     Arrival date & time 06/18/15  1338 History   First MD Initiated Contact with Patient 06/18/15 1417     Chief Complaint  Patient presents with  . Eye Pain    Erin Thomas is a 35 y.o. female who presents to the ED Complaining of a foreign body sensation to her right eye since waking up this afternoon. The patient reports that after waking up this afternoon she felt there is a foreign body to her lateral aspect of her right eye. She does not wear contacts or glasses. She has attempted no treatments prior to arrival. 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 She does not work with metals. No known foreign bodies to her eye. She denies fevers, double vision, headache, lightheadedness, dizziness or syncope.   Patient is a 35 y.o. female presenting with eye pain. The history is provided by the patient. No language interpreter was used.  Eye Pain Pertinent negatives include no fever, numbness or rash.    Past Medical History  Diagnosis Date  . Hypertension    Past Surgical History  Procedure Laterality Date  . Cesarean section    . Hip surgery      left hip pinning from slipped disk  . Tubal ligation     History reviewed. No pertinent family history. Social History  Substance Use Topics  . Smoking status: Current Every Day Smoker -- 0.50 packs/day for 10 years    Types: Cigarettes  . Smokeless tobacco: None  . Alcohol Use: No   OB History    Gravida Para Term Preterm AB TAB SAB Ectopic Multiple Living   3 2             Review of Systems  Constitutional: Negative for fever.  Eyes: Positive for photophobia and pain. Negative for discharge, itching and visual disturbance.  Skin: Negative for rash.  Neurological: Negative for dizziness, syncope, light-headedness and numbness.      Allergies  Penicillins  Home Medications   Prior to Admission medications   Medication Sig Start Date End Date Taking? Authorizing Provider  erythromycin ophthalmic ointment Place 1  application into the right eye every 6 (six) hours. Place 1/2 inch ribbon of ointment in the affected eye 4 times a day 06/18/15   Everlene FarrierWilliam Demarkis Gheen, PA-C   BP 144/98 mmHg  Pulse 104  Temp(Src) 98.4 F (36.9 C) (Oral)  Resp 18  Ht 5\' 4"  (1.626 m)  Wt 97.07 kg  BMI 36.72 kg/m2  SpO2 99%  LMP  (LMP Unknown) Physical Exam  Constitutional: She appears well-developed and well-nourished. No distress.  Nontoxic appearing.  HENT:  Head: Normocephalic and atraumatic.  Right Ear: External ear normal.  Left Ear: External ear normal.  Mouth/Throat: Oropharynx is clear and moist. No oropharyngeal exudate.  Eyes: Conjunctivae and EOM are normal. Pupils are equal, round, and reactive to light. Right eye exhibits no chemosis, no discharge, no exudate and no hordeolum. Foreign body present in the right eye. Left eye exhibits no discharge. Right conjunctiva is not injected. Right conjunctiva has no hemorrhage. No scleral icterus. Right eye exhibits normal extraocular motion.  Right eye was anesthetized with tetracaine and stained with fluorescein. No forcing uptake noted. No Seidel sign or corneal ulcer noted. There is a small what appears to be a speck of dirt to her lateral aspect of her right eye. This was removed with the Q-tip. Patient reported complete relief of pain with tetracaine.  Neck: Normal range of motion. Neck  supple.  Cardiovascular: Normal rate, regular rhythm and intact distal pulses.   Heart rate 92.  Pulmonary/Chest: Effort normal. No respiratory distress.  Lymphadenopathy:    She has no cervical adenopathy.  Neurological: She is alert. Coordination normal.  Skin: Skin is warm and dry. No rash noted. She is not diaphoretic. No erythema. No pallor.  Psychiatric: She has a normal mood and affect. Her behavior is normal.  Nursing note and vitals reviewed.    Visual Acuity  Right Eye Distance: 50 Left Eye Distance: 200 Bilateral Distance: 50  Right Eye Near: R Near: 20 Left Eye Near:   L Near: 20 Bilateral Near:  20  ED Course  Procedures (including critical care time) Labs Review Labs Reviewed - No data to display  Imaging Review No results found.    EKG Interpretation None      Filed Vitals:   06/18/15 1400  BP: 144/98  Pulse: 104  Temp: 98.4 F (36.9 C)  TempSrc: Oral  Resp: 18  Height:  (1.626 m)  Weight: 97.07 kg  SpO2: 99%     MDM   Meds given in ED:  Medications  fluorescein 1 MG ophthalmic strip (  Given by Other 06/18/15 1431)  tetracaine (PONTOCAINE) 0.5 % ophthalmic solution (  Given by Other 06/18/15 1432)    New Prescriptions   ERYTHROMYCIN OPHTHALMIC OINTMENT    Place 1 application into the right eye every 6 (six) hours. Place 1/2 inch ribbon of ointment in the affected eye 4 times a day    Final diagnoses:  Sensation of foreign body in eye   This  is a 35 y.o. female who presents to the ED Complaining of a foreign body sensation to her right eye since waking up this afternoon. The patient reports that after waking up this afternoon she felt there is a foreign body to her lateral aspect of her right eye. She does not wear contacts or glasses. She has attempted no treatments prior to arrival. On exam there is no conjunctival injection noted. She reports complete relief of pain with tetracaine. No fluorescein uptake. No corneal ulcer or abrasion noted. There is a small speck of dirt removed with Q-tip during exam. No other foreign bodies noted. No Seidel sign. Vision is grossly intact. As I did remove a foreign body will start the patient on erythromycin ointment and have her follow-up with ophthalmology. I advised the patient to follow-up with their primary care provider this week. I advised the patient to return to the emergency department with new or worsening symptoms or new concerns. The patient verbalized understanding and agreement with plan.       Everlene Farrier, PA-C 06/18/15 1511  Lavera Guise, MD 06/19/15 [phone]

## 2015-06-18 NOTE — ED Notes (Signed)
Reports right eye pain since waking up this afternoon.  Redness noted to eye.

## 2015-06-18 NOTE — ED Notes (Signed)
Pt given d/c instructions as per chart. Verbalizes understanding. No questions. Rx x 1 

## 2015-06-18 NOTE — Discharge Instructions (Signed)

## 2015-08-09 DIAGNOSIS — A5901 Trichomonal vulvovaginitis: Secondary | ICD-10-CM | POA: Diagnosis not present

## 2015-08-09 DIAGNOSIS — Z01419 Encounter for gynecological examination (general) (routine) without abnormal findings: Secondary | ICD-10-CM | POA: Diagnosis not present

## 2015-08-09 DIAGNOSIS — Z113 Encounter for screening for infections with a predominantly sexual mode of transmission: Secondary | ICD-10-CM | POA: Diagnosis not present

## 2015-08-09 DIAGNOSIS — N760 Acute vaginitis: Secondary | ICD-10-CM | POA: Diagnosis not present

## 2015-10-18 DIAGNOSIS — S161XXA Strain of muscle, fascia and tendon at neck level, initial encounter: Secondary | ICD-10-CM | POA: Diagnosis not present

## 2015-10-22 DIAGNOSIS — F17210 Nicotine dependence, cigarettes, uncomplicated: Secondary | ICD-10-CM | POA: Diagnosis not present

## 2015-10-22 DIAGNOSIS — S29012A Strain of muscle and tendon of back wall of thorax, initial encounter: Secondary | ICD-10-CM | POA: Diagnosis not present

## 2015-10-22 DIAGNOSIS — M546 Pain in thoracic spine: Secondary | ICD-10-CM | POA: Diagnosis not present

## 2015-10-22 DIAGNOSIS — S39012A Strain of muscle, fascia and tendon of lower back, initial encounter: Secondary | ICD-10-CM | POA: Diagnosis not present

## 2015-10-22 DIAGNOSIS — M542 Cervicalgia: Secondary | ICD-10-CM | POA: Diagnosis not present

## 2015-10-30 DIAGNOSIS — R0602 Shortness of breath: Secondary | ICD-10-CM | POA: Diagnosis not present

## 2015-10-30 DIAGNOSIS — Z79899 Other long term (current) drug therapy: Secondary | ICD-10-CM | POA: Diagnosis not present

## 2015-10-30 DIAGNOSIS — M546 Pain in thoracic spine: Secondary | ICD-10-CM | POA: Diagnosis not present

## 2015-10-30 DIAGNOSIS — R079 Chest pain, unspecified: Secondary | ICD-10-CM | POA: Diagnosis not present

## 2015-10-30 DIAGNOSIS — F17210 Nicotine dependence, cigarettes, uncomplicated: Secondary | ICD-10-CM | POA: Diagnosis not present

## 2015-11-01 DIAGNOSIS — F17200 Nicotine dependence, unspecified, uncomplicated: Secondary | ICD-10-CM | POA: Diagnosis not present

## 2015-11-01 DIAGNOSIS — Z09 Encounter for follow-up examination after completed treatment for conditions other than malignant neoplasm: Secondary | ICD-10-CM | POA: Diagnosis not present

## 2015-11-01 DIAGNOSIS — Z87891 Personal history of nicotine dependence: Secondary | ICD-10-CM | POA: Diagnosis not present

## 2015-11-01 DIAGNOSIS — M5412 Radiculopathy, cervical region: Secondary | ICD-10-CM | POA: Diagnosis not present

## 2015-11-06 ENCOUNTER — Other Ambulatory Visit: Payer: Self-pay | Admitting: Internal Medicine

## 2015-11-06 DIAGNOSIS — M5412 Radiculopathy, cervical region: Secondary | ICD-10-CM

## 2015-11-15 ENCOUNTER — Ambulatory Visit
Admission: RE | Admit: 2015-11-15 | Discharge: 2015-11-15 | Disposition: A | Discharge status: Home / Self Care | Payer: 59 | Source: Ambulatory Visit | Attending: Internal Medicine | Admitting: Internal Medicine

## 2015-11-15 DIAGNOSIS — M4802 Spinal stenosis, cervical region: Secondary | ICD-10-CM | POA: Diagnosis not present

## 2015-11-15 DIAGNOSIS — M5412 Radiculopathy, cervical region: Secondary | ICD-10-CM

## 2015-11-18 DIAGNOSIS — M5412 Radiculopathy, cervical region: Secondary | ICD-10-CM | POA: Diagnosis not present

## 2015-11-18 DIAGNOSIS — M9981 Other biomechanical lesions of cervical region: Secondary | ICD-10-CM | POA: Diagnosis not present

## 2015-11-24 DIAGNOSIS — M5412 Radiculopathy, cervical region: Secondary | ICD-10-CM | POA: Diagnosis not present

## 2015-11-24 DIAGNOSIS — M50222 Other cervical disc displacement at C5-C6 level: Secondary | ICD-10-CM | POA: Diagnosis not present

## 2015-11-24 DIAGNOSIS — M50022 Cervical disc disorder at C5-C6 level with myelopathy: Secondary | ICD-10-CM | POA: Diagnosis not present

## 2015-12-05 DIAGNOSIS — Z87891 Personal history of nicotine dependence: Secondary | ICD-10-CM | POA: Diagnosis not present

## 2015-12-05 DIAGNOSIS — F17200 Nicotine dependence, unspecified, uncomplicated: Secondary | ICD-10-CM | POA: Diagnosis not present

## 2015-12-05 DIAGNOSIS — M9981 Other biomechanical lesions of cervical region: Secondary | ICD-10-CM | POA: Diagnosis not present

## 2015-12-12 DIAGNOSIS — R030 Elevated blood-pressure reading, without diagnosis of hypertension: Secondary | ICD-10-CM | POA: Diagnosis not present

## 2015-12-12 DIAGNOSIS — Z6837 Body mass index (BMI) 37.0-37.9, adult: Secondary | ICD-10-CM | POA: Diagnosis not present

## 2015-12-12 DIAGNOSIS — M50222 Other cervical disc displacement at C5-C6 level: Secondary | ICD-10-CM | POA: Diagnosis not present

## 2015-12-16 DIAGNOSIS — M5412 Radiculopathy, cervical region: Secondary | ICD-10-CM | POA: Diagnosis not present

## 2015-12-16 DIAGNOSIS — M9981 Other biomechanical lesions of cervical region: Secondary | ICD-10-CM | POA: Diagnosis not present

## 2015-12-26 ENCOUNTER — Other Ambulatory Visit: Payer: Self-pay | Admitting: Neurosurgery

## 2015-12-29 ENCOUNTER — Encounter (HOSPITAL_COMMUNITY): Payer: Self-pay

## 2015-12-29 ENCOUNTER — Encounter (HOSPITAL_COMMUNITY)
Admission: RE | Admit: 2015-12-29 | Discharge: 2015-12-29 | Disposition: A | Discharge status: Home / Self Care | Payer: 59 | Source: Ambulatory Visit | Attending: Neurosurgery | Admitting: Neurosurgery

## 2015-12-29 DIAGNOSIS — I10 Essential (primary) hypertension: Secondary | ICD-10-CM | POA: Insufficient documentation

## 2015-12-29 DIAGNOSIS — G5603 Carpal tunnel syndrome, bilateral upper limbs: Secondary | ICD-10-CM | POA: Diagnosis not present

## 2015-12-29 DIAGNOSIS — Z01812 Encounter for preprocedural laboratory examination: Secondary | ICD-10-CM | POA: Insufficient documentation

## 2015-12-29 DIAGNOSIS — M5020 Other cervical disc displacement, unspecified cervical region: Secondary | ICD-10-CM | POA: Diagnosis not present

## 2015-12-29 HISTORY — DX: Carpal tunnel syndrome, bilateral upper limbs: G56.03

## 2015-12-29 HISTORY — DX: Other specified postprocedural states: Z98.890

## 2015-12-29 HISTORY — DX: Reserved for concepts with insufficient information to code with codable children: IMO0002

## 2015-12-29 HISTORY — DX: Nausea with vomiting, unspecified: R11.2

## 2015-12-29 LAB — CBC
HEMATOCRIT: 39.7 % (ref 36.0–46.0)
Hemoglobin: 13.3 g/dL (ref 12.0–15.0)
MCH: 29.6 pg (ref 26.0–34.0)
MCHC: 33.5 g/dL (ref 30.0–36.0)
MCV: 88.4 fL (ref 78.0–100.0)
PLATELETS: 293 10*3/uL (ref 150–400)
RBC: 4.49 MIL/uL (ref 3.87–5.11)
RDW: 13.5 % (ref 11.5–15.5)
WBC: 8.5 10*3/uL (ref 4.0–10.5)

## 2015-12-29 LAB — HCG, SERUM, QUALITATIVE: Preg, Serum: NEGATIVE

## 2015-12-29 LAB — BASIC METABOLIC PANEL
Anion gap: 6 (ref 5–15)
CHLORIDE: 108 mmol/L (ref 101–111)
CO2: 23 mmol/L (ref 22–32)
CREATININE: 0.73 mg/dL (ref 0.44–1.00)
Calcium: 8.9 mg/dL (ref 8.9–10.3)
GFR calc Af Amer: >60 mL/min (ref >60)
GFR calc non Af Amer: >60 mL/min (ref >60)
GLUCOSE: 99 mg/dL (ref 65–99)
Potassium: 3.9 mmol/L (ref 3.5–5.1)
SODIUM: 137 mmol/L (ref 135–145)

## 2015-12-29 LAB — SURGICAL PCR SCREEN
MRSA, PCR: NEGATIVE
STAPHYLOCOCCUS AUREUS: NEGATIVE

## 2015-12-29 NOTE — Pre-Procedure Instructions (Signed)
Erin HainesChristina Thomas  12/29/2015      [facility] - [place], [place] - [id number] [place] [address] Phone: [phone] Fax: [phone]  [id number] - [place], [place] - [address] AT [address]OSS [address] [place] [address] Phone: [phone] Fax: [phone]  [facility] [phone] - [place] - [address] AT [facility] [address] [place]  [id number] Phone: [phone] Fax: [phone]    Your procedure is scheduled on Wednesday, January 03, 2016.  [facility] Admitting at 6:00 A.M. ( per MD)  Call this number if you have problems the morning of surgery:  [phone]   Remember:  Do not eat food or drink liquids after midnight Tuesday, January 02, 2016  Take these medicines the morning of surgery with A SIP OF WATER :gabapentin (NEURONTIN), if needed: oxycodone for pain Stop taking Aspirin, vitamins, fish oil and herbal medications. Do not take any NSAIDs ie: Ibuprofen, Advil, Naproxen, BC and Goody Powder or any medication containing Aspirin; stop now.  Do not wear jewelry, make-up or nail polish.  Do not wear lotions, powders, or perfumes, or deoderant.  Do not shave 48 hours prior to surgery.    Do not bring valuables to the hospital.  [facility] is not responsible for any belongings or valuables.  Contacts, dentures or bridgework may not be worn into surgery.  Leave your suitcase in the car.  After surgery it may be brought to your room.  For patients admitted to the hospital, discharge time will be determined by your treatment team.  Patients discharged the day of surgery will not be allowed to drive home.  Special instructions:  [facility] - Preparing for Surgery  Before surgery, you can play an important role.  Because skin is not sterile, your skin needs to be as free of germs as  possible.  You can reduce the number of germs on you skin by washing with CHG (chlorahexidine gluconate) soap before surgery.  CHG is an antiseptic cleaner which kills germs and bonds with the skin to continue killing germs even after washing.  Please DO NOT use if you have an allergy to CHG or antibacterial soaps.  If your skin becomes reddened/irritated stop using the CHG and inform your nurse when you arrive at Short Stay.  Do not shave (including legs and underarms) for at least 48 hours prior to the first CHG shower.  You may shave your face.  Please follow these instructions carefully:   1.  Shower with CHG Soap the night before surgery and the morning of Surgery.  2.  If you choose to wash your hair, wash your hair first as usual with your normal shampoo.  3.  After you shampoo, rinse your hair and body thoroughly to remove the Shampoo.  4.  Use CHG as you would any other liquid soap.  You can apply chg directly  to the skin and wash gently with scrungie or a clean washcloth.  5.  Apply the CHG Soap to your body ONLY FROM THE NECK DOWN.  Do not use on open wounds or open sores.  Avoid contact with your eyes, ears, mouth and genitals (private parts).  Wash genitals (private parts) with your normal soap.  6.  Wash thoroughly, paying special attention to the area where your  surgery will be performed.  7.  Thoroughly rinse your body with warm water from the neck down.  8.  DO NOT shower/wash with your normal soap after using and rinsing off the CHG Soap.  9.  Pat yourself dry with a clean towel.            10.  Wear clean pajamas.            11.  Place clean sheets on your bed the night of your first shower and do not sleep with pets.  Day of Surgery  Do not apply any lotions/deoderants the morning of surgery.  Please wear clean clothes to the hospital/surgery center. Please read over the following fact sheets that you were given. Pain Booklet, Coughing and Deep Breathing, MRSA Information  and Surgical Site Infection Prevention

## 2015-12-29 NOTE — Progress Notes (Signed)
Pt denies SOB, chest pain, and being under the care of a cardiologist. Pt denies having a stress test echo and cardiac cath. Pt denies having any recent labs.

## 2016-01-02 ENCOUNTER — Encounter (HOSPITAL_COMMUNITY): Payer: Self-pay | Admitting: Certified Registered Nurse Anesthetist

## 2016-01-03 ENCOUNTER — Ambulatory Visit (HOSPITAL_COMMUNITY): Payer: 59

## 2016-01-03 ENCOUNTER — Encounter (HOSPITAL_COMMUNITY)
Admission: RE | Disposition: A | Discharge status: Home / Self Care | Payer: Self-pay | Source: Ambulatory Visit | Attending: Neurosurgery

## 2016-01-03 ENCOUNTER — Ambulatory Visit (HOSPITAL_COMMUNITY): Payer: 59 | Admitting: Certified Registered Nurse Anesthetist

## 2016-01-03 ENCOUNTER — Encounter (HOSPITAL_COMMUNITY): Payer: Self-pay | Admitting: *Deleted

## 2016-01-03 ENCOUNTER — Ambulatory Visit (HOSPITAL_COMMUNITY)
Admission: RE | Admit: 2016-01-03 | Discharge: 2016-01-04 | Disposition: A | Discharge status: Home / Self Care | Payer: 59 | Source: Ambulatory Visit | Attending: Neurosurgery | Admitting: Neurosurgery

## 2016-01-03 DIAGNOSIS — M50122 Cervical disc disorder at C5-C6 level with radiculopathy: Secondary | ICD-10-CM | POA: Insufficient documentation

## 2016-01-03 DIAGNOSIS — M50222 Other cervical disc displacement at C5-C6 level: Secondary | ICD-10-CM | POA: Diagnosis not present

## 2016-01-03 DIAGNOSIS — Z880 Allergy status to penicillin: Secondary | ICD-10-CM | POA: Insufficient documentation

## 2016-01-03 DIAGNOSIS — M5412 Radiculopathy, cervical region: Secondary | ICD-10-CM | POA: Diagnosis not present

## 2016-01-03 DIAGNOSIS — M4322 Fusion of spine, cervical region: Secondary | ICD-10-CM | POA: Diagnosis not present

## 2016-01-03 DIAGNOSIS — Z6837 Body mass index (BMI) 37.0-37.9, adult: Secondary | ICD-10-CM | POA: Diagnosis not present

## 2016-01-03 DIAGNOSIS — G5602 Carpal tunnel syndrome, left upper limb: Secondary | ICD-10-CM | POA: Diagnosis not present

## 2016-01-03 DIAGNOSIS — I10 Essential (primary) hypertension: Secondary | ICD-10-CM | POA: Diagnosis not present

## 2016-01-03 DIAGNOSIS — F17200 Nicotine dependence, unspecified, uncomplicated: Secondary | ICD-10-CM | POA: Diagnosis not present

## 2016-01-03 DIAGNOSIS — Z419 Encounter for procedure for purposes other than remedying health state, unspecified: Secondary | ICD-10-CM

## 2016-01-03 DIAGNOSIS — M5020 Other cervical disc displacement, unspecified cervical region: Secondary | ICD-10-CM | POA: Diagnosis present

## 2016-01-03 HISTORY — PX: ANTERIOR CERVICAL DECOMP/DISCECTOMY FUSION: SHX1161

## 2016-01-03 SURGERY — ANTERIOR CERVICAL DECOMPRESSION/DISCECTOMY FUSION 1 LEVEL
Anesthesia: General | Site: Neck

## 2016-01-03 MED ORDER — HEMOSTATIC AGENTS (NO CHARGE) OPTIME
TOPICAL | Status: DC | PRN
  Administered 2016-01-03: 1 via TOPICAL

## 2016-01-03 MED ORDER — OXYCODONE HCL 5 MG/5ML PO SOLN: 5.0000 mg | Freq: Once | ORAL | Status: AC | PRN

## 2016-01-03 MED ORDER — PHENOL 1.4 % MT LIQD: 1.0000 | OROMUCOSAL | Status: DC | PRN

## 2016-01-03 MED ORDER — MIDAZOLAM HCL 5 MG/5ML IJ SOLN
INTRAMUSCULAR | Status: DC | PRN
  Administered 2016-01-03: 2 mg via INTRAVENOUS

## 2016-01-03 MED ORDER — ONDANSETRON HCL 4 MG/2ML IJ SOLN
INTRAMUSCULAR | Status: DC | PRN
  Administered 2016-01-03: 4 mg via INTRAVENOUS

## 2016-01-03 MED ORDER — LIDOCAINE-EPINEPHRINE (PF) 1 %-1:200000 IJ SOLN
INTRAMUSCULAR | Status: DC | PRN
  Administered 2016-01-03: 10 mL

## 2016-01-03 MED ORDER — ROCURONIUM BROMIDE 50 MG/5ML IV SOSY
PREFILLED_SYRINGE | INTRAVENOUS | Status: AC
  Filled 2016-01-03: qty 5

## 2016-01-03 MED ORDER — FENTANYL CITRATE (PF) 100 MCG/2ML IJ SOLN
INTRAMUSCULAR | Status: AC
  Filled 2016-01-03: qty 4

## 2016-01-03 MED ORDER — LACTATED RINGERS IV SOLN
INTRAVENOUS | Status: DC
  Administered 2016-01-03 (×2): via INTRAVENOUS

## 2016-01-03 MED ORDER — THROMBIN 5000 UNITS EX SOLR
CUTANEOUS | Status: AC
  Filled 2016-01-03: qty 10000

## 2016-01-03 MED ORDER — DIPHENHYDRAMINE HCL 50 MG/ML IJ SOLN
INTRAMUSCULAR | Status: DC | PRN
  Administered 2016-01-03: 25 mg via INTRAVENOUS

## 2016-01-03 MED ORDER — HYDROCODONE-ACETAMINOPHEN 5-325 MG PO TABS: 1.0000 | ORAL_TABLET | ORAL | Status: DC | PRN

## 2016-01-03 MED ORDER — MAGNESIUM CITRATE PO SOLN: 1.0000 | Freq: Once | ORAL | Status: DC | PRN

## 2016-01-03 MED ORDER — SCOPOLAMINE 1 MG/3DAYS TD PT72
MEDICATED_PATCH | TRANSDERMAL | Status: DC | PRN
  Administered 2016-01-03: 1 via TRANSDERMAL

## 2016-01-03 MED ORDER — KETOROLAC TROMETHAMINE 30 MG/ML IJ SOLN
30.0000 mg | Freq: Four times a day (QID) | INTRAMUSCULAR | Status: DC
  Administered 2016-01-03 – 2016-01-04 (×3): 30 mg via INTRAVENOUS
  Filled 2016-01-03 (×3): qty 1

## 2016-01-03 MED ORDER — LIDOCAINE HCL (CARDIAC) 20 MG/ML IV SOLN
INTRAVENOUS | Status: DC | PRN
  Administered 2016-01-03: 60 mg via INTRAVENOUS

## 2016-01-03 MED ORDER — THROMBIN 5000 UNITS EX SOLR
CUTANEOUS | Status: DC | PRN
  Administered 2016-01-03 (×2): 5000 [IU] via TOPICAL

## 2016-01-03 MED ORDER — MENTHOL 3 MG MT LOZG
1.0000 | LOZENGE | OROMUCOSAL | Status: DC | PRN
Start: 2016-01-03 — End: 2016-01-04

## 2016-01-03 MED ORDER — ROCURONIUM BROMIDE 100 MG/10ML IV SOLN
INTRAVENOUS | Status: DC | PRN
Start: 2016-01-03 — End: 2016-01-03
  Administered 2016-01-03: 50 mg via INTRAVENOUS

## 2016-01-03 MED ORDER — FENTANYL CITRATE (PF) 100 MCG/2ML IJ SOLN
INTRAMUSCULAR | Status: AC
  Filled 2016-01-03: qty 2

## 2016-01-03 MED ORDER — DEXAMETHASONE SODIUM PHOSPHATE 10 MG/ML IJ SOLN
INTRAMUSCULAR | Status: DC | PRN
  Administered 2016-01-03: 10 mg via INTRAVENOUS

## 2016-01-03 MED ORDER — OXYCODONE HCL 5 MG PO TABS
5.0000 mg | ORAL_TABLET | Freq: Once | ORAL | Status: AC | PRN
  Administered 2016-01-03: 5 mg via ORAL

## 2016-01-03 MED ORDER — SODIUM CHLORIDE 0.9% FLUSH
3.0000 mL | Freq: Two times a day (BID) | INTRAVENOUS | Status: DC
  Administered 2016-01-03 (×2): 3 mL via INTRAVENOUS

## 2016-01-03 MED ORDER — HYDROMORPHONE HCL 1 MG/ML IJ SOLN
INTRAMUSCULAR | Status: AC
  Filled 2016-01-03: qty 1

## 2016-01-03 MED ORDER — DIAZEPAM 5 MG PO TABS
5.0000 mg | ORAL_TABLET | Freq: Four times a day (QID) | ORAL | Status: DC | PRN
  Administered 2016-01-03 (×2): 5 mg via ORAL
  Filled 2016-01-03 (×2): qty 1

## 2016-01-03 MED ORDER — OXYCODONE-ACETAMINOPHEN 5-325 MG PO TABS
1.0000 | ORAL_TABLET | ORAL | Status: DC | PRN
  Administered 2016-01-03 – 2016-01-04 (×4): 2 via ORAL
  Filled 2016-01-03 (×4): qty 2

## 2016-01-03 MED ORDER — ACETAMINOPHEN 160 MG/5ML PO SOLN
325.0000 mg | ORAL | Status: DC | PRN
  Filled 2016-01-03: qty 20.3

## 2016-01-03 MED ORDER — OXYCODONE HCL 5 MG PO TABS
ORAL_TABLET | ORAL | Status: AC
  Filled 2016-01-03: qty 1

## 2016-01-03 MED ORDER — ONDANSETRON HCL 4 MG/2ML IJ SOLN: 4.0000 mg | INTRAMUSCULAR | Status: DC | PRN

## 2016-01-03 MED ORDER — ACETAMINOPHEN 325 MG PO TABS
650.0000 mg | ORAL_TABLET | ORAL | Status: DC | PRN
Start: 2016-01-03 — End: 2016-01-04

## 2016-01-03 MED ORDER — BISACODYL 5 MG PO TBEC: 5.0000 mg | DELAYED_RELEASE_TABLET | Freq: Every day | ORAL | Status: DC | PRN

## 2016-01-03 MED ORDER — SUGAMMADEX SODIUM 200 MG/2ML IV SOLN
INTRAVENOUS | Status: DC | PRN
  Administered 2016-01-03: 200 mg via INTRAVENOUS

## 2016-01-03 MED ORDER — ZOLPIDEM TARTRATE 5 MG PO TABS: 5.0000 mg | ORAL_TABLET | Freq: Every evening | ORAL | Status: DC | PRN

## 2016-01-03 MED ORDER — CHLORHEXIDINE GLUCONATE CLOTH 2 % EX PADS: 6.0000 | MEDICATED_PAD | Freq: Once | CUTANEOUS | Status: DC

## 2016-01-03 MED ORDER — PROPOFOL 10 MG/ML IV BOLUS
INTRAVENOUS | Status: DC | PRN
  Administered 2016-01-03: 200 mg via INTRAVENOUS

## 2016-01-03 MED ORDER — SODIUM CHLORIDE 0.9% FLUSH: 3.0000 mL | INTRAVENOUS | Status: DC | PRN

## 2016-01-03 MED ORDER — LIDOCAINE-EPINEPHRINE (PF) 1 %-1:200000 IJ SOLN
INTRAMUSCULAR | Status: AC
  Filled 2016-01-03: qty 30

## 2016-01-03 MED ORDER — HYDROMORPHONE HCL 1 MG/ML IJ SOLN
0.2500 mg | INTRAMUSCULAR | Status: DC | PRN
  Administered 2016-01-03 (×2): 0.5 mg via INTRAVENOUS

## 2016-01-03 MED ORDER — LIDOCAINE 2% (20 MG/ML) 5 ML SYRINGE
INTRAMUSCULAR | Status: AC
  Filled 2016-01-03: qty 5

## 2016-01-03 MED ORDER — VANCOMYCIN HCL IN DEXTROSE 1-5 GM/200ML-% IV SOLN
1000.0000 mg | INTRAVENOUS | Status: AC
  Administered 2016-01-03: 1000 mg via INTRAVENOUS
  Filled 2016-01-03: qty 200

## 2016-01-03 MED ORDER — GABAPENTIN 300 MG PO CAPS
300.0000 mg | ORAL_CAPSULE | Freq: Three times a day (TID) | ORAL | Status: DC
  Administered 2016-01-03 – 2016-01-04 (×3): 300 mg via ORAL
  Filled 2016-01-03: qty 1
  Filled 2016-01-03: qty 3
  Filled 2016-01-03: qty 1

## 2016-01-03 MED ORDER — 0.9 % SODIUM CHLORIDE (POUR BTL) OPTIME
TOPICAL | Status: DC | PRN
Start: 2016-01-03 — End: 2016-01-03
  Administered 2016-01-03: 1000 mL

## 2016-01-03 MED ORDER — PHENYLEPHRINE HCL 10 MG/ML IJ SOLN
INTRAMUSCULAR | Status: DC | PRN
  Administered 2016-01-03: 80 ug via INTRAVENOUS

## 2016-01-03 MED ORDER — POTASSIUM CHLORIDE IN NACL 20-0.9 MEQ/L-% IV SOLN: INTRAVENOUS | Status: DC

## 2016-01-03 MED ORDER — DOCUSATE SODIUM 100 MG PO CAPS
100.0000 mg | ORAL_CAPSULE | Freq: Two times a day (BID) | ORAL | Status: DC
  Administered 2016-01-03 – 2016-01-04 (×2): 100 mg via ORAL
  Filled 2016-01-03 (×2): qty 1

## 2016-01-03 MED ORDER — PROPOFOL 10 MG/ML IV BOLUS
INTRAVENOUS | Status: AC
  Filled 2016-01-03: qty 40

## 2016-01-03 MED ORDER — MORPHINE SULFATE (PF) 4 MG/ML IV SOLN: 1.0000 mg | INTRAVENOUS | Status: DC | PRN

## 2016-01-03 MED ORDER — ACETAMINOPHEN 325 MG PO TABS: 325.0000 mg | ORAL_TABLET | ORAL | Status: DC | PRN

## 2016-01-03 MED ORDER — SENNOSIDES-DOCUSATE SODIUM 8.6-50 MG PO TABS: 1.0000 | ORAL_TABLET | Freq: Every evening | ORAL | Status: DC | PRN

## 2016-01-03 MED ORDER — MIDAZOLAM HCL 2 MG/2ML IJ SOLN
INTRAMUSCULAR | Status: AC
  Filled 2016-01-03: qty 2

## 2016-01-03 MED ORDER — ACETAMINOPHEN 650 MG RE SUPP: 650.0000 mg | RECTAL | Status: DC | PRN

## 2016-01-03 MED ORDER — FENTANYL CITRATE (PF) 100 MCG/2ML IJ SOLN
INTRAMUSCULAR | Status: DC | PRN
  Administered 2016-01-03 (×6): 50 ug via INTRAVENOUS

## 2016-01-03 SURGICAL SUPPLY — 73 items
BIT DRILL NEURO 2X3.1 SFT TUCH (MISCELLANEOUS) ×1 IMPLANT
BLADE CLIPPER SURG (BLADE) IMPLANT
BNDG GAUZE ELAST 4 BULKY (GAUZE/BANDAGES/DRESSINGS) IMPLANT
BUR DRUM 4.0 (BURR) IMPLANT
BUR MATCHSTICK NEURO 3.0 LAGG (BURR) ×2 IMPLANT
CANISTER SUCT 3000ML PPV (MISCELLANEOUS) ×2 IMPLANT
CARTRIDGE OIL MAESTRO DRILL (MISCELLANEOUS) ×1 IMPLANT
DECANTER SPIKE VIAL GLASS SM (MISCELLANEOUS) ×2 IMPLANT
DERMABOND ADVANCED (GAUZE/BANDAGES/DRESSINGS) ×1
DERMABOND ADVANCED .7 DNX12 (GAUZE/BANDAGES/DRESSINGS) ×1 IMPLANT
DIFFUSER DRILL AIR PNEUMATIC (MISCELLANEOUS) ×2 IMPLANT
DRAPE HALF SHEET 40X57 (DRAPES) IMPLANT
DRAPE LAPAROTOMY 100X72 PEDS (DRAPES) ×2 IMPLANT
DRAPE MICROSCOPE LEICA (MISCELLANEOUS) ×2 IMPLANT
DRAPE POUCH INSTRU U-SHP 10X18 (DRAPES) ×2 IMPLANT
DRILL NEURO 2X3.1 SOFT TOUCH (MISCELLANEOUS) ×2
DURAPREP 6ML APPLICATOR 50/CS (WOUND CARE) ×2 IMPLANT
ELECT COATED BLADE 2.86 ST (ELECTRODE) ×2 IMPLANT
ELECT REM PT RETURN 9FT ADLT (ELECTROSURGICAL) ×2
ELECTRODE REM PT RTRN 9FT ADLT (ELECTROSURGICAL) ×1 IMPLANT
GAUZE SPONGE 4X4 16PLY XRAY LF (GAUZE/BANDAGES/DRESSINGS) IMPLANT
GLOVE BIO SURGEON STRL SZ 6.5 (GLOVE) IMPLANT
GLOVE BIO SURGEON STRL SZ7 (GLOVE) IMPLANT
GLOVE BIO SURGEON STRL SZ7.5 (GLOVE) IMPLANT
GLOVE BIO SURGEON STRL SZ8 (GLOVE) IMPLANT
GLOVE BIO SURGEON STRL SZ8.5 (GLOVE) IMPLANT
GLOVE BIOGEL M 7.0 STRL (GLOVE) ×8 IMPLANT
GLOVE BIOGEL M 8.0 STRL (GLOVE) IMPLANT
GLOVE BIOGEL PI IND STRL 7.5 (GLOVE) ×5 IMPLANT
GLOVE BIOGEL PI INDICATOR 7.5 (GLOVE) ×5
GLOVE ECLIPSE 6.5 STRL STRAW (GLOVE) ×2 IMPLANT
GLOVE ECLIPSE 7.0 STRL STRAW (GLOVE) IMPLANT
GLOVE ECLIPSE 7.5 STRL STRAW (GLOVE) IMPLANT
GLOVE ECLIPSE 8.0 STRL XLNG CF (GLOVE) ×2 IMPLANT
GLOVE ECLIPSE 8.5 STRL (GLOVE) IMPLANT
GLOVE EXAM NITRILE LRG STRL (GLOVE) IMPLANT
GLOVE EXAM NITRILE XL STR (GLOVE) IMPLANT
GLOVE EXAM NITRILE XS STR PU (GLOVE) IMPLANT
GLOVE INDICATOR 6.5 STRL GRN (GLOVE) IMPLANT
GLOVE INDICATOR 7.0 STRL GRN (GLOVE) IMPLANT
GLOVE INDICATOR 7.5 STRL GRN (GLOVE) IMPLANT
GLOVE INDICATOR 8.0 STRL GRN (GLOVE) IMPLANT
GLOVE INDICATOR 8.5 STRL (GLOVE) IMPLANT
GLOVE OPTIFIT SS 8.0 STRL (GLOVE) IMPLANT
GLOVE SURG SS PI 6.5 STRL IVOR (GLOVE) IMPLANT
GOWN SPEC L3 XXLG W/TWL (GOWN DISPOSABLE) ×2 IMPLANT
GOWN STRL REUS W/ TWL LRG LVL3 (GOWN DISPOSABLE) ×3 IMPLANT
GOWN STRL REUS W/ TWL XL LVL3 (GOWN DISPOSABLE) ×1 IMPLANT
GOWN STRL REUS W/TWL 2XL LVL3 (GOWN DISPOSABLE) IMPLANT
GOWN STRL REUS W/TWL LRG LVL3 (GOWN DISPOSABLE) ×3
GOWN STRL REUS W/TWL XL LVL3 (GOWN DISPOSABLE) ×1
KIT BASIN OR (CUSTOM PROCEDURE TRAY) ×2 IMPLANT
KIT ROOM TURNOVER OR (KITS) ×2 IMPLANT
NEEDLE HYPO 25X1 1.5 SAFETY (NEEDLE) ×2 IMPLANT
NEEDLE SPNL 22GX3.5 QUINCKE BK (NEEDLE) ×4 IMPLANT
NS IRRIG 1000ML POUR BTL (IV SOLUTION) ×2 IMPLANT
OIL CARTRIDGE MAESTRO DRILL (MISCELLANEOUS) ×2
PACK LAMINECTOMY NEURO (CUSTOM PROCEDURE TRAY) ×2 IMPLANT
PAD ARMBOARD 7.5X6 YLW CONV (MISCELLANEOUS) ×6 IMPLANT
PIN DISTRACTION 14MM (PIN) ×4 IMPLANT
PLATE HELIX R 22MM (Plate) ×2 IMPLANT
RUBBERBAND STERILE (MISCELLANEOUS) ×4 IMPLANT
SCREW 4.0X13 (Screw) ×4 IMPLANT
SCREW 4.0X13MM (Screw) ×4 IMPLANT
SPACER CC-ACF 8MM PARALLEL (Bone Implant) ×2 IMPLANT
SPONGE INTESTINAL PEANUT (DISPOSABLE) ×2 IMPLANT
SPONGE SURGIFOAM ABS GEL SZ50 (HEMOSTASIS) ×2 IMPLANT
SUT VIC AB 0 CT1 27 (SUTURE) ×1
SUT VIC AB 0 CT1 27XBRD ANTBC (SUTURE) ×1 IMPLANT
SUT VIC AB 3-0 SH 8-18 (SUTURE) ×2 IMPLANT
TOWEL OR 17X24 6PK STRL BLUE (TOWEL DISPOSABLE) ×2 IMPLANT
TOWEL OR 17X26 10 PK STRL BLUE (TOWEL DISPOSABLE) ×2 IMPLANT
WATER STERILE IRR 1000ML POUR (IV SOLUTION) ×2 IMPLANT

## 2016-01-03 NOTE — Op Note (Signed)
01/03/2016  12:02 PM  PATIENT:  Erin Thomas  35 y.o. female with severe neck pain and a large hnp at C5//6. 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 [name] was taken to the operating room, intubated, and placed under general anesthesia without difficulty. She was positioned supine with her head in slight extension on a horseshoe headrest. The neck was prepped and draped in a sterile manner. I infiltrated 6 cc's 1/2%lidocaine/1:200,000 strength epinephrine into the planned incision starting from the midline to the medial border of the left sternocleidomastoid muscle. I opened the incision with a 10 blade and dissected sharply through soft tissue to the platysma. I dissected in the plane superior to the platysma both rostrally and caudally. I then opened the platysma in a horizontal fashion with Metzenbaum scissors, and dissected in the inferior plane rostrally and caudally. With both blunt and sharp technique I created an avascular corridor to the cervical spine. I placed a spinal needle(s) in the disc space at 3/4, and 4/5 . I then reflected the longus colli from C5 to C6 and placed self retaining retractors. I opened the disc space(s) at C5/6 with a 15 blade. I removed disc with curettes, Kerrison punches, and the drill. Using the drill I removed osteophytes and prepared for the  decompression.  I decompressed the spinal canal and the C6 root(s) with the drill, Kerrison punches, and the curettes. I used the microscope to aid in microdissection. I removed the posterior longitudinal ligament to fully expose and decompress the thecal sac. I exposed the roots laterally taking down the 5/6 uncovertebral joints. With the decompression complete we moved on to the arthrodesis. I used the drill to level the surfaces of C5, and C6. I removed soft tissue to prepare the disc space and the bony surfaces. I measured the space and placed an 8mm structural allograft into the disc space.  We then placed the anterior instrumentation. I placed 2 screws in each vertebral body through the plate. I locked the screws into place. Intraoperative xray showed the graft, plate, and screws to be in good position. I irrigated the wound, achieved hemostasis, and closed the wound in layers. I approximated the platysma, and the subcuticular plane with vicryl sutures. I used Dermabond for a sterile dressing.   PLAN OF CARE: Admit for overnight observation  PATIENT DISPOSITION:  PACU - hemodynamically stable.   Delay start of Pharmacological VTE agent (>24hrs) due to surgical blood loss or risk of bleeding:  yes

## 2016-01-03 NOTE — Transfer of Care (Signed)
Immediate Anesthesia Transfer of Care Note  Patient: Erin Thomas  Procedure(s) Performed: Procedure(s): ANTERIOR CERVICAL DECOMPRESSION/DISCECTOMY FUSION CERVICAL FIVE-SIX (N/A)  Patient Location: PACU  Anesthesia Type:General  Level of Consciousness: awake, alert  and oriented  Airway & Oxygen Therapy: Patient Spontanous Breathing and Patient connected to nasal cannula oxygen  Post-op Assessment: Report given to RN, Post -op Vital signs reviewed and stable and Patient moving all extremities X 4  Post vital signs: Reviewed and stable  Last Vitals:  Vitals:   01/03/16 0642 01/03/16 1150  BP: 114/61   Pulse: 75   Resp: 18   Temp: 37.2 C (P) 36.6 C    Last Pain:  Vitals:   01/03/16 0642  TempSrc: Oral      Patients Stated Pain Goal: 2 (01/03/16 0701)  Complications: No apparent anesthesia complications

## 2016-01-03 NOTE — H&P (Signed)
BP 114/61   Pulse 75   Temp 98.9 F (37.2 C) (Oral)   Resp 18   Wt 99.3 kg (219 lb)   LMP 11/30/2015 (Exact Date)   SpO2 97%   BMI 37.59 kg/m  Ms. Rennaker presents today for evaluation of severe pain which she had in her neck and in her shoulders. As a result of that, she had a CT scan and actually a CT angio done in October 2017 secondary to the pain. She was not found to have any type of pulmonary emboli, but she was known to have some chronic changes which were in the cervical spine at C4-5 and C5-6. Her primary care physician subsequently ordered an MRI of the cervical spine, which showed a large disc herniation which appears to be acute at C5-6 causing cord compression and an altered cord signal. She has had fairly severe numbness in both hands. She was told she had carpal tunnel syndrome in her hands previously but that was on the right side. She did wear a cock-up-splint for right-sided numbness in the past, but it is the left hand that bothers her more today. She does state that she has had some problems with her balance, and has been tripping over her feet. She feels if she moves a certain way, she gets an electric feeling radiating from the head to her feet. She says that she has had that for about a year, and she has had these symptoms for about a year, but they are occurring more frequently now. She also reports some pain in the left hip. She is [age]-years of age, right-handed, works as a Water quality scientistphlebotomist.  REVIEW OF SYSTEMS: Review of systems is positive for balance problems, swelling in the feet, arthritis, neck pain, arm weakness, leg weakness, back pain, arm pain, joint pain, and problems with coordination in her arms. CURRENT MEDICAL PROBLEMS: She feels weakness in the arms and hands, numbness and tingling in arms and legs. She says the pain has become worse in the arms and her back. She said this started 09/02/2015 while at work bending over to then take blood. She said rest and pain have  helped. SOCIAL HISTORY: She does smoke. She does drink socially. No history of substance abuse. FAMILY HISTORY: Mother is 2162, has diabetes and hypertension. Father shows no information provided. Family history is significant for the diabetes, hypertension, and arthritis. CURRENT MEDICATIONS: She is currently on Prednisone and Oxycodone for her pain. PHYSICAL EXAMINATION: She is 5 feet 4 inches, weighs 220 pounds, blood pressure is 132/80, pulse is 113, respiratory rate is 14, temperature is 97.5.  On examination she is alert, oriented x4, and answering all questions appropriately. She has a Hoffman's sign in the right hand, but she has no clonus. She does have a positive Tinel's sign over the left wrist, none over the right. She doesn't have a Hoffman's on the left side. Reflexes are simply 2+ at the biceps, triceps, brachioradialis, and knees and ankles. She has a negative Romberg. She can tandem walk with ease. Memory, language, attention span, and fund of knowledge is normal. Speech is clear and fluent. Tongue and uvula are in the midline. Hearing is intact to voice. Pupils are equal, round, and reactive to light.  IMAGING STUDIES: MRI of the cervical spine is reviewed. It shows a large displaced cyst at C5-6 compressing the spinal cord causing high signal within the spinal cord on T2 imaging. There is distortion of the cord at this level. She has a  osteophyte at C4-5 eccentric to the right side, but the cord itself is normal both rostral and caudal to the C5-6 area. 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 I do believe that Mrs. Martina SinnerDula's symptoms are being caused by this large herniate disc in the cervical spine. I do believe that her best option by far, is anterior cervical decompression and subsequent arthrodesis. Risks including bleeding, infection, paralysis, weakness in one or both arms, one or both legs, bowel  dysfunction, bladder dysfunction, inability to control bowel and bladder function, no relief of symptoms , hardware failure, fusion failure, damage to the vocal cords causing hoarseness, all were discussed along with other risks. She does understand, and was given a detailed instruction sheet with regards to the operation. She will be scheduled for the surgery as soon as we can.

## 2016-01-03 NOTE — Anesthesia Procedure Notes (Signed)
Procedure Name: Intubation Date/Time: 01/03/2016 9:54 AM Performed by: Val EagleMOSER, CHRISTOPHER Pre-anesthesia Checklist: Patient identified, Emergency Drugs available, Suction available, Patient being monitored and Timeout performed Patient Re-evaluated:Patient Re-evaluated prior to inductionOxygen Delivery Method: Circle System Utilized Preoxygenation: Pre-oxygenation with 100% oxygen Intubation Type: IV induction Ventilation: Mask ventilation without difficulty Laryngoscope Size: Mac and 4 Grade View: Grade I Tube type: Oral Tube size: 7.0 mm Number of attempts: 1 Airway Equipment and Method: Stylet and Oral airway Placement Confirmation: ETT inserted through vocal cords under direct vision,  positive ETCO2 and breath sounds checked- equal and bilateral Secured at: 21 cm Tube secured with: Tape Dental Injury: Teeth and Oropharynx as per pre-operative assessment

## 2016-01-04 ENCOUNTER — Encounter (HOSPITAL_COMMUNITY): Payer: Self-pay | Admitting: Neurosurgery

## 2016-01-04 DIAGNOSIS — I10 Essential (primary) hypertension: Secondary | ICD-10-CM | POA: Diagnosis not present

## 2016-01-04 DIAGNOSIS — Z6837 Body mass index (BMI) 37.0-37.9, adult: Secondary | ICD-10-CM | POA: Diagnosis not present

## 2016-01-04 DIAGNOSIS — M50122 Cervical disc disorder at C5-C6 level with radiculopathy: Secondary | ICD-10-CM | POA: Diagnosis not present

## 2016-01-04 DIAGNOSIS — Z880 Allergy status to penicillin: Secondary | ICD-10-CM | POA: Diagnosis not present

## 2016-01-04 DIAGNOSIS — F17200 Nicotine dependence, unspecified, uncomplicated: Secondary | ICD-10-CM | POA: Diagnosis not present

## 2016-01-04 DIAGNOSIS — G5602 Carpal tunnel syndrome, left upper limb: Secondary | ICD-10-CM | POA: Diagnosis not present

## 2016-01-04 NOTE — Discharge Instructions (Signed)

## 2016-01-04 NOTE — Anesthesia Postprocedure Evaluation (Signed)
Anesthesia Post Note  Patient: Erin Thomas  Procedure(s) Performed: Procedure(s) (LRB): ANTERIOR CERVICAL DECOMPRESSION/DISCECTOMY FUSION CERVICAL FIVE-SIX (N/A)  Patient location during evaluation: PACU Anesthesia Type: General Level of consciousness: awake and alert Pain management: pain level controlled Vital Signs Assessment: post-procedure vital signs reviewed and stable Respiratory status: spontaneous breathing, respiratory function stable and patient connected to nasal cannula oxygen Cardiovascular status: stable Postop Assessment: no signs of nausea or vomiting Anesthetic complications: no       Last Vitals:  Vitals:   01/04/16 0746 01/04/16 1234  BP: 102/68 102/72  Pulse: 64 65  Resp: 16 16  Temp: 37.1 C 37.1 C    Last Pain:  Vitals:   01/04/16 0440  TempSrc: Oral  PainSc:                  [name]

## 2016-01-04 NOTE — Discharge Summary (Signed)
Physician Discharge Summary  Patient ID: Erin HainesChristina Thomas MRN: [id number] DOB/AGE: 34/04/1980 34 y.o.  Admit date: 01/03/2016 Discharge date: 01/04/2016  Admission Diagnoses:HNP cervical 5/6  Discharge Diagnoses:  Active Problems:   HNP (herniated nucleus pulposus), cervical   Discharged Condition: good  Hospital Course: [name] was admitted and taken to the operating room for an uncomplicated ACDF at C5/6. She is voiding, ambulating, and tolerating a regular diet. Her wound is clean, dry, and without signs of infection. Her speaking voice is normal.   Treatments: surgery: Anterior Cervical decompression C5/6 Arthrodesis C5/6 with 8mm structural allograft Anterior instrumentation(Nuvasive) C5/6   Discharge Exam: Blood pressure 102/68, pulse 64, temperature 98.8 F (37.1 C), resp. rate 16, weight 99.3 kg (219 lb), last menstrual period 11/30/2015, SpO2 100 %, unknown if currently breastfeeding. General appearance: alert, cooperative, appears stated age and no distress Neurologic: Alert and oriented X 3, normal strength and tone. Normal symmetric reflexes. Normal coordination and gait  Disposition: 01-Home or Self Care HERNIATED NUCLEUS PULPOSUS  Allergies as of 01/04/2016      Reactions   Penicillins Hives   Has patient had a PCN reaction causing immediate rash, facial/tongue/throat swelling, SOB or lightheadedness with hypotension: Yes Has patient had a PCN reaction causing severe rash involving mucus membranes or skin necrosis:unsure Has patient had a PCN reaction that required hospitalization No Has patient had a PCN reaction occurring within the last 10 years:NO If all of the above answers are "NO", then may proceed with Cephalosporin use.      Medication List    TAKE these medications   gabapentin 300 MG capsule Commonly known as:  NEURONTIN Take 300 mg by mouth 3 (three) times daily.   oxyCODONE-acetaminophen 10-325 MG tablet Commonly known as:  PERCOCET Take  0.5-1 tablets by mouth 3 (three) times daily as needed for severe pain.      Follow-up Information    Auden Wettstein L, MD Follow up in 3 week(s).   Specialty:  Neurosurgery Why:  please call the office to make an appointment Contact information: [address] [place] [address] [phone]           Signed: Carmela HurtCABBELL,Nithila Sumners L 01/04/2016, 10:39 AM

## 2016-01-04 NOTE — Anesthesia Preprocedure Evaluation (Signed)
Anesthesia Evaluation  Patient identified by MRN, date of birth, ID band Patient awake    Reviewed: Allergy & Precautions, NPO status , Patient's Chart, lab work & pertinent test results  History of Anesthesia Complications (+) PONV and history of anesthetic complications  Airway Mallampati: II  TM Distance: >3 FB Neck ROM: Full    Dental  (+) Teeth Intact   Pulmonary neg shortness of breath, neg COPD, Current Smoker,    breath sounds clear to auscultation       Cardiovascular hypertension,  Rhythm:Regular     Neuro/Psych  Neuromuscular disease    GI/Hepatic negative GI ROS, Neg liver ROS,   Endo/Other  Morbid obesity  Renal/GU negative Renal ROS     Musculoskeletal   Abdominal   Peds  Hematology negative hematology ROS (+)   Anesthesia Other Findings   Reproductive/Obstetrics                             Anesthesia Physical Anesthesia Plan  ASA: II  Anesthesia Plan: General   Post-op Pain Management:    Induction: Intravenous  Airway Management Planned: Oral ETT  Additional Equipment: None  Intra-op Plan:   Post-operative Plan: Extubation in OR  Informed Consent: I have reviewed the patients History and Physical, chart, labs and discussed the procedure including the risks, benefits and alternatives for the proposed anesthesia with the patient or authorized representative who has indicated his/her understanding and acceptance.   Dental advisory given  Plan Discussed with: CRNA and Surgeon  Anesthesia Plan Comments:         Anesthesia Quick Evaluation

## 2016-01-04 NOTE — Progress Notes (Signed)
Patient alert and oriented, mae's well, voiding adequate amount of urine, swallowing without difficulty, no c/o pain at time of discharge. Patient discharged home with family. Script and discharged instructions given to patient. Patient and family stated understanding of instructions given. Patient has an appointment with Dr. Cabbell   

## 2016-01-25 DIAGNOSIS — M545 Low back pain: Secondary | ICD-10-CM | POA: Diagnosis not present

## 2016-01-25 DIAGNOSIS — N390 Urinary tract infection, site not specified: Secondary | ICD-10-CM | POA: Diagnosis not present

## 2016-01-25 DIAGNOSIS — M9981 Other biomechanical lesions of cervical region: Secondary | ICD-10-CM | POA: Diagnosis not present

## 2016-01-25 DIAGNOSIS — M5412 Radiculopathy, cervical region: Secondary | ICD-10-CM | POA: Diagnosis not present

## 2016-01-30 DIAGNOSIS — M50222 Other cervical disc displacement at C5-C6 level: Secondary | ICD-10-CM | POA: Diagnosis not present

## 2016-01-30 DIAGNOSIS — R030 Elevated blood-pressure reading, without diagnosis of hypertension: Secondary | ICD-10-CM | POA: Diagnosis not present

## 2016-01-30 DIAGNOSIS — Z6838 Body mass index (BMI) 38.0-38.9, adult: Secondary | ICD-10-CM | POA: Diagnosis not present

## 2016-02-19 DIAGNOSIS — M545 Low back pain: Secondary | ICD-10-CM | POA: Diagnosis not present

## 2016-02-19 DIAGNOSIS — M9981 Other biomechanical lesions of cervical region: Secondary | ICD-10-CM | POA: Diagnosis not present

## 2016-02-29 DIAGNOSIS — M9981 Other biomechanical lesions of cervical region: Secondary | ICD-10-CM | POA: Diagnosis not present

## 2016-02-29 DIAGNOSIS — M545 Low back pain: Secondary | ICD-10-CM | POA: Diagnosis not present

## 2016-02-29 DIAGNOSIS — G8929 Other chronic pain: Secondary | ICD-10-CM | POA: Diagnosis not present

## 2016-02-29 MED FILL — OXYCODONE-APAP 10-325 TAB: 10-325 | 20 days supply | Qty: 60 | Fill #0

## 2016-03-06 DIAGNOSIS — G8929 Other chronic pain: Secondary | ICD-10-CM | POA: Diagnosis not present

## 2016-03-06 DIAGNOSIS — M545 Low back pain: Secondary | ICD-10-CM | POA: Diagnosis not present

## 2016-03-13 DIAGNOSIS — M545 Low back pain: Secondary | ICD-10-CM | POA: Diagnosis not present

## 2016-03-13 DIAGNOSIS — N390 Urinary tract infection, site not specified: Secondary | ICD-10-CM | POA: Diagnosis not present

## 2016-03-13 DIAGNOSIS — G8929 Other chronic pain: Secondary | ICD-10-CM | POA: Diagnosis not present

## 2016-03-13 DIAGNOSIS — A499 Bacterial infection, unspecified: Secondary | ICD-10-CM | POA: Diagnosis not present

## 2016-03-21 DIAGNOSIS — M4306 Spondylolysis, lumbar region: Secondary | ICD-10-CM | POA: Diagnosis not present

## 2016-03-21 DIAGNOSIS — Z6838 Body mass index (BMI) 38.0-38.9, adult: Secondary | ICD-10-CM | POA: Diagnosis not present

## 2016-04-24 DIAGNOSIS — M545 Low back pain: Secondary | ICD-10-CM | POA: Diagnosis not present

## 2016-04-24 DIAGNOSIS — S32059A Unspecified fracture of fifth lumbar vertebra, initial encounter for closed fracture: Secondary | ICD-10-CM | POA: Diagnosis not present

## 2016-05-24 DIAGNOSIS — G5602 Carpal tunnel syndrome, left upper limb: Secondary | ICD-10-CM | POA: Diagnosis not present

## 2016-05-24 DIAGNOSIS — S32059A Unspecified fracture of fifth lumbar vertebra, initial encounter for closed fracture: Secondary | ICD-10-CM | POA: Diagnosis not present

## 2016-06-06 MED FILL — GABAPENTIN 300 MG CAPSULE: 300 | 30 days supply | Qty: 90 | Fill #0

## 2016-06-13 DIAGNOSIS — M25552 Pain in left hip: Secondary | ICD-10-CM | POA: Diagnosis not present

## 2016-06-23 DIAGNOSIS — S32059A Unspecified fracture of fifth lumbar vertebra, initial encounter for closed fracture: Secondary | ICD-10-CM | POA: Diagnosis not present

## 2016-06-23 DIAGNOSIS — E669 Obesity, unspecified: Secondary | ICD-10-CM | POA: Diagnosis not present

## 2016-06-23 DIAGNOSIS — M25552 Pain in left hip: Secondary | ICD-10-CM | POA: Diagnosis not present

## 2016-06-23 DIAGNOSIS — R3129 Other microscopic hematuria: Secondary | ICD-10-CM | POA: Diagnosis not present

## 2016-06-24 MED FILL — OXYCODONE-ACETAMINOPHEN 10-: 10-325 | 30 days supply | Qty: 120 | Fill #0

## 2016-06-25 DIAGNOSIS — M4300 Spondylolysis, site unspecified: Secondary | ICD-10-CM | POA: Diagnosis not present

## 2016-06-25 DIAGNOSIS — Z6837 Body mass index (BMI) 37.0-37.9, adult: Secondary | ICD-10-CM | POA: Diagnosis not present

## 2016-06-25 DIAGNOSIS — M545 Low back pain: Secondary | ICD-10-CM | POA: Diagnosis not present

## 2016-07-22 DIAGNOSIS — Z79899 Other long term (current) drug therapy: Secondary | ICD-10-CM | POA: Diagnosis not present

## 2016-07-22 DIAGNOSIS — E8881 Metabolic syndrome: Secondary | ICD-10-CM | POA: Diagnosis not present

## 2016-07-22 DIAGNOSIS — R5383 Other fatigue: Secondary | ICD-10-CM | POA: Diagnosis not present

## 2016-07-22 DIAGNOSIS — S32059A Unspecified fracture of fifth lumbar vertebra, initial encounter for closed fracture: Secondary | ICD-10-CM | POA: Diagnosis not present

## 2016-07-22 DIAGNOSIS — R0602 Shortness of breath: Secondary | ICD-10-CM | POA: Diagnosis not present

## 2018-03-15 ENCOUNTER — Encounter (HOSPITAL_BASED_OUTPATIENT_CLINIC_OR_DEPARTMENT_OTHER): Payer: Self-pay | Admitting: Emergency Medicine

## 2018-03-15 ENCOUNTER — Emergency Department (HOSPITAL_BASED_OUTPATIENT_CLINIC_OR_DEPARTMENT_OTHER)
Admission: EM | Admit: 2018-03-15 | Discharge: 2018-03-15 | Disposition: A | Discharge status: Home / Self Care | Payer: Medicaid Other | Attending: Emergency Medicine | Admitting: Emergency Medicine

## 2018-03-15 ENCOUNTER — Emergency Department (HOSPITAL_BASED_OUTPATIENT_CLINIC_OR_DEPARTMENT_OTHER): Payer: Medicaid Other

## 2018-03-15 ENCOUNTER — Other Ambulatory Visit: Payer: Self-pay

## 2018-03-15 DIAGNOSIS — I10 Essential (primary) hypertension: Secondary | ICD-10-CM | POA: Insufficient documentation

## 2018-03-15 DIAGNOSIS — R311 Benign essential microscopic hematuria: Secondary | ICD-10-CM | POA: Insufficient documentation

## 2018-03-15 DIAGNOSIS — M545 Low back pain, unspecified: Secondary | ICD-10-CM

## 2018-03-15 DIAGNOSIS — Y999 Unspecified external cause status: Secondary | ICD-10-CM | POA: Diagnosis not present

## 2018-03-15 DIAGNOSIS — Y939 Activity, unspecified: Secondary | ICD-10-CM | POA: Insufficient documentation

## 2018-03-15 DIAGNOSIS — S39012A Strain of muscle, fascia and tendon of lower back, initial encounter: Secondary | ICD-10-CM | POA: Insufficient documentation

## 2018-03-15 DIAGNOSIS — Z79899 Other long term (current) drug therapy: Secondary | ICD-10-CM | POA: Insufficient documentation

## 2018-03-15 DIAGNOSIS — F17210 Nicotine dependence, cigarettes, uncomplicated: Secondary | ICD-10-CM | POA: Diagnosis not present

## 2018-03-15 DIAGNOSIS — Y929 Unspecified place or not applicable: Secondary | ICD-10-CM | POA: Insufficient documentation

## 2018-03-15 DIAGNOSIS — G8929 Other chronic pain: Secondary | ICD-10-CM

## 2018-03-15 DIAGNOSIS — S3992XA Unspecified injury of lower back, initial encounter: Secondary | ICD-10-CM | POA: Diagnosis present

## 2018-03-15 DIAGNOSIS — A599 Trichomoniasis, unspecified: Secondary | ICD-10-CM | POA: Insufficient documentation

## 2018-03-15 DIAGNOSIS — X58XXXA Exposure to other specified factors, initial encounter: Secondary | ICD-10-CM | POA: Diagnosis not present

## 2018-03-15 LAB — URINALYSIS, ROUTINE W REFLEX MICROSCOPIC
Bilirubin Urine: NEGATIVE
GLUCOSE, UA: NEGATIVE mg/dL
Ketones, ur: NEGATIVE mg/dL
NITRITE: NEGATIVE
Protein, ur: NEGATIVE mg/dL
Specific Gravity, Urine: 1.01 (ref 1.005–1.030)
pH: 7.5 (ref 5.0–8.0)

## 2018-03-15 LAB — WET PREP, GENITAL
CLUE CELLS WET PREP: NONE SEEN
Sperm: NONE SEEN
YEAST WET PREP: NONE SEEN

## 2018-03-15 LAB — URINALYSIS, MICROSCOPIC (REFLEX)

## 2018-03-15 LAB — PREGNANCY, URINE: Preg Test, Ur: NEGATIVE

## 2018-03-15 MED ORDER — METRONIDAZOLE 500 MG PO TABS
2000.0000 mg | ORAL_TABLET | Freq: Once | ORAL | Status: AC
  Administered 2018-03-15: 2000 mg via ORAL
  Filled 2018-03-15: qty 4

## 2018-03-15 MED ORDER — LIDOCAINE 4 % EX PTCH
1.00 | MEDICATED_PATCH | CUTANEOUS | Status: DC
Start: 2018-03-15 — End: 2018-03-15

## 2018-03-15 MED ORDER — DIAZEPAM 5 MG PO TABS
5.0000 mg | ORAL_TABLET | Freq: Once | ORAL | Status: AC
  Administered 2018-03-15: 5 mg via ORAL
  Filled 2018-03-15: qty 1

## 2018-03-15 NOTE — ED Notes (Signed)
ED Provider at bedside. 

## 2018-03-15 NOTE — ED Triage Notes (Signed)
Pt having chronic back pain 8/10 no responding to pain management medication, pt states she is having numbness waist down with this pain.

## 2018-03-15 NOTE — Discharge Instructions (Addendum)
You have been seen in the Emergency Department (ED)  today for back pain.  Your workup and exam have not shown any acute abnormalities and you are likely suffering from muscle strain or possible problems with your discs, but there is no treatment that will fix your symptoms at this time.  Please take Motrin (ibuprofen) as needed for your pain according to the instructions written on the box.  Do not take Ibuprofen while taking steroid dose pack.   You were treated for an STD. Your partner needs to be treated prior to engaging in sex. Please follow up with PCP or OBGYN regarding this and for re-check.   Please follow up with your PCP doctor as soon as possible regarding today's ED visit and your back pain. Return to the ED for worsening back pain, fever, weakness or numbness of either leg, or if you develop either (1) an inability to urinate or have bowel movements, or (2) loss of your ability to control your bathroom functions (if you start having "accidents"), or if you develop other new symptoms that concern you.  Neurosurgeon Dr. Lovell Sheehan info given in case you cannot recall the name of your neurosurgeon. Advised you call either one regarding back pain.

## 2018-03-15 NOTE — ED Notes (Addendum)
Pt d/c home with family.

## 2018-03-15 NOTE — ED Provider Notes (Signed)
[facility] EMERGENCY DEPARTMENT Provider Note   CSN: [id number] Arrival date & time: 03/15/18  1811    History   Chief Complaint Chief Complaint  Patient presents with  . Back Pain    HPI Erin Thomas is a 38 y.o. female.     Pt presents to the ED complaining of worsening right lower back pain x 1 week. She also complains of 1 episode of urinary incontinence that occurred 4 days ago and saddle anesthesia x 2 days. Pt has a hx of chronic back pain; is on #120 10-325 mg Percocet monthly given by pain management. She also takes Gabapentin for the pain. Pt states no relief with pain 1 week ago; prompting her to go to her PCP who have her meloxicam without relief. She also states she got an injection of some sort but cannot recall the name of the medication. Pt then went to [facility] 2 days ago for similar symptoms; per chart review pt denied urinary incontinence or saddle anesthesia; no imaging was done; pt was discharged home with a steroid dose pack and lidocaine patches. She reports she did not pick up the lidocaine patches because she already had similar patches at home; the steroids have not helped her pain prompting her to come in tonight. No chronic steroid use. No IVDA. Denies fever, chills, weakness, bowel incontinence.   The history is provided by the patient.    Past Medical History:  Diagnosis Date  . Carpal tunnel syndrome, bilateral   . HNP (herniated nucleus pulposus)   . Hypertension    during pregnancy only  . PONV (postoperative nausea and vomiting)     Patient Active Problem List   Diagnosis Date Noted  . HNP (herniated nucleus pulposus), cervical 01/03/2016    Past Surgical History:  Procedure Laterality Date  . ANTERIOR CERVICAL DECOMP/DISCECTOMY FUSION N/A 01/03/2016   Procedure: ANTERIOR CERVICAL DECOMPRESSION/DISCECTOMY FUSION CERVICAL FIVE-SIX;  Surgeon: Coletta Memos, MD;  Location: [facility] OR;  Service: Neurosurgery;  Laterality: N/A;    . CESAREAN SECTION    . HIP SURGERY     left hip pinning from slipped disk  . tubal ligation       OB History    Gravida  3   Para  2   Term      Preterm      AB      Living        SAB      TAB      Ectopic      Multiple      Live Births               Home Medications    Prior to Admission medications   Medication Sig Start Date End Date Taking? Authorizing Provider  gabapentin (NEURONTIN) 300 MG capsule Take 300 mg by mouth 3 (three) times daily. 11/18/15   [provider]  oxyCODONE-acetaminophen (PERCOCET) 10-325 MG tablet Take 0.5-1 tablets by mouth 3 (three) times daily as needed for severe pain. 12/05/15   [provider]    Family History Family History  Problem Relation Age of Onset  . Heart attack Mother   . Diabetes Mother   . Liver cancer Other     Social History Social History   Tobacco Use  . Smoking status: Current Every Day Smoker    Packs/day: 1.00    Years: 10.00    Pack years: 10.00    Types: Cigarettes  . Smokeless tobacco: Never  Used  Substance Use Topics  . Alcohol use: No  . Drug use: No     Allergies   Penicillins   Review of Systems Review of Systems  Constitutional: Negative for chills, fever and unexpected weight change.  Respiratory: Negative for shortness of breath.   Cardiovascular: Negative for chest pain.  Gastrointestinal: Negative for nausea and vomiting.  Genitourinary: Negative for difficulty urinating.       + urinary incontinence  Musculoskeletal: Positive for back pain. Negative for neck pain.  Skin: Negative for wound.  Neurological: Positive for numbness. Negative for weakness.     Physical Exam Updated Vital Signs BP 134/74 (BP Location: Right Arm)   Pulse 98   Temp 98.4 F (36.9 C) (Oral)   Resp 18   Ht  (1.626 m)   Wt 99.8 kg   LMP 03/10/2018   SpO2 100%   BMI 37.76 kg/m   Physical Exam Vitals signs and nursing note reviewed.  Constitutional:       General: She is not in acute distress.    Appearance: She is well-developed. She is obese.  HENT:     Head: Normocephalic and atraumatic.  Eyes:     Conjunctiva/sclera: Conjunctivae normal.  Neck:     Musculoskeletal: Neck supple.  Cardiovascular:     Rate and Rhythm: Normal rate and regular rhythm.     Heart sounds: No murmur.  Pulmonary:     Effort: Pulmonary effort is normal. No respiratory distress.     Breath sounds: Normal breath sounds.  Abdominal:     Palpations: Abdomen is soft.     Tenderness: There is no abdominal tenderness.  Musculoskeletal: Normal range of motion.     Comments: No TTP of C, T, or L midline or paraspinal areas. No tenderness to hips bilaterally.   Skin:    General: Skin is warm and dry.  Neurological:     General: No focal deficit present.     Mental Status: She is alert.     Sensory: No sensory deficit.     Motor: No weakness.     Gait: Gait normal.     Comments: Pt able to ambulate to bathroom. Sensation intact to sharp and dull on LEs, buttock region, and groin. Strength 5/5 in LEs bilaterally. Patellar and achilles reflexes 2+.       ED Treatments / Results  Labs (all labs ordered are listed, but only abnormal results are displayed) Labs Reviewed  PREGNANCY, URINE  URINALYSIS, ROUTINE W REFLEX MICROSCOPIC    EKG None  Radiology No results found.  Procedures Procedures (including critical care time)  Medications Ordered in ED Medications  diazepam (VALIUM) tablet 5 mg (has no administration in time range)     Initial Impression / Assessment and Plan / ED Course  I have reviewed the triage vital signs and the nursing notes.  Pertinent labs & imaging results that were available during my care of the patient were reviewed by me and considered in my medical decision making (see chart for details).    Pt with hx of chronic back pain. Receives #120 Percocet monthly from pain management. Also takes Gabapentin for pain. Reports  worsening pain x 1 week. Was seen at [facility] for this; no imaging done at that time; given steroid dose back and lidocaine patches and told to follow up with pain management/PCP. Pt returns tonight complaining of pain as well as saddle anesthesia and urinary incontinence (1 episode that occurred 4 days  ago). Pt states she did not mention this to [facility] at time of visit. Pt able to ambulate into ED tonight. Visibly saw patient ambulating to bathroom without assistance. Sensation intact to sharp and dull in bilateral lower extremities including groin region and buttock area. No weakness to BLEs; able to resist me with leg flexion. No significant TTP to C, T, or L spine midline or paraspinally. Will get post void bladder scan given pt's urinary incontinence complaint. Low suspicion for cauda equina given physical exam findings. Will reevaluate s/p bladder scan.   9:12 PM Nursing staff informed post void was 175 mL. Pt states she had just urinated about 5 minutes ago. Pt likely holding onto urine due to pain; would be more concerned if > 200. Awaiting UA. Dr Jacqulyn Bath will evaluate pt once UA comes back  9:39 PM U/A positive for trichomonas; pt also with Hgb in urine. Pt endorses urinary frequency but states she has been drinking a lot of water recently. UA negative for UTI. Will get CT Renal Study to evaluate for kidney stones/look for any acute changes in lumbar spine. Will also do pelvic exam given trich in urine. Discussed with patient that we will do pelvic; will have pt's female friend step out to discuss trich findings and to complete pelvic exam; will evaluate if pt would like to be tested for HIV and syphilis during discussion   10:04 PM Pt transferred to other room for pelvic exam; discussed findings with pt; reports she had trich about 6 months ago and was treated for same; she states her boyfriend never went to the doctor and she believes he probably passed it back to her; she would like to be  tested for GC/chlamydia as well as HIV and syphilis today. Will place those orders.  10:30 PM Pelvic exam completed; pt not exquisitely tender with bimanual exam; no obvious discharge to vault; no cervicitis. Low suspicion for PID. Still awaiting CT results  11:00 PM  Pt wet prep positive for trich; will treat with 2 g Flagyl while in the ED. CT Renal Study returned without acute findings; nonobstructive stones seen in the kidneys; doubt this is causing pt's pain. Discussed findings with patient while she was receiving Flagyl dose; advised to refrain from alcohol for the next couple of days while medication is still in symptom. Strongly advised partner to get tested for same and to refrain from sex until he does so. Advised pt to follow up with PCP/OBGYN regarding hematuria in UA. Also advised to follow up with pain management/neurosurgeon regarding back pain. Pt in agreement with plan. Stable for discharge.              Final Clinical Impressions(s) / ED Diagnoses   Final diagnoses:  None    ED Discharge Orders    None       Tanda Rockers, PA-C 03/15/18 2325    Maia Plan, MD 03/16/18 1003

## 2018-03-15 NOTE — ED Notes (Signed)
Pt returned from CT °

## 2018-03-17 LAB — RPR: RPR: NONREACTIVE

## 2018-03-17 LAB — GC/CHLAMYDIA PROBE AMP (~~LOC~~) NOT AT ARMC
CHLAMYDIA, DNA PROBE: NEGATIVE
NEISSERIA GONORRHEA: NEGATIVE

## 2018-03-17 LAB — HIV ANTIBODY (ROUTINE TESTING W REFLEX): HIV Screen 4th Generation wRfx: NONREACTIVE

## 2018-05-11 IMAGING — MR MR CERVICAL SPINE W/O CM
4 of 5 series · 26 of 48 positions shown · non-contrast
Comparison: Prior radiographs from 10/22/2015.

CLINICAL DATA: Initial evaluation for acute neck and back pain,
radiating into left arm with numbness in both hands. Symptoms for 5
weeks.

EXAM:
MRI CERVICAL SPINE WITHOUT CONTRAST
TECHNIQUE: Multiplanar, multisequence MR imaging of the cervical spine was
performed. No intravenous contrast was administered.

[Series 6: T1 · sagittal · 3.0mm · 0.66mm/px · 6 of 15 slices shown]
[im 1/15]
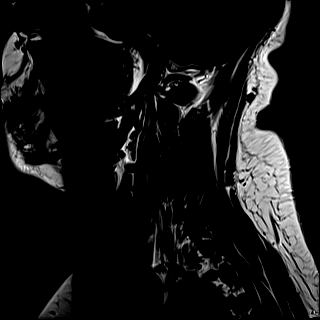
[im 3/15]
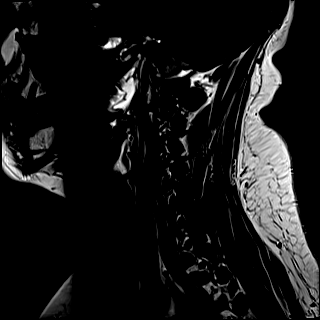
[im 6/15]
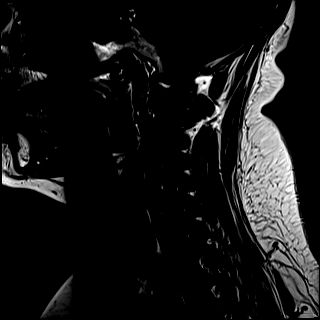
[im 9/15]
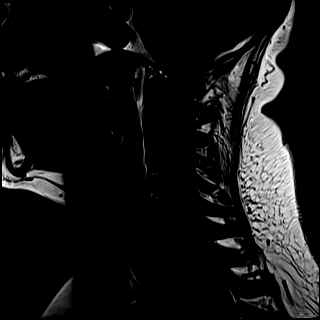
[im 12/15]
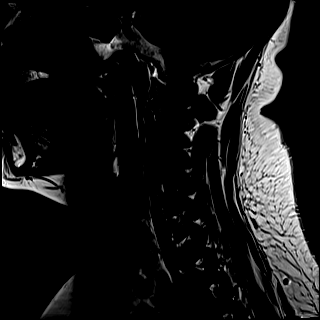
[im 15/15]
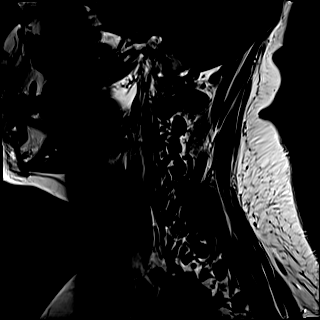

[Series 7: T2 · sagittal · 3.0mm · 0.55mm/px · 7 of 15 slices shown (1 of 2)]
[im 1/15]
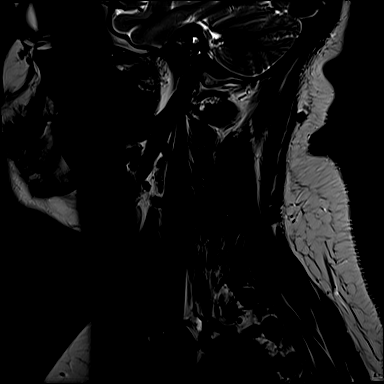
[im 3/15]
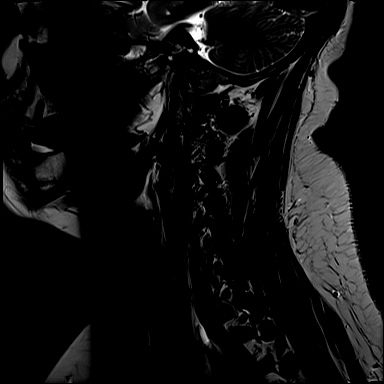
[im 5/15]
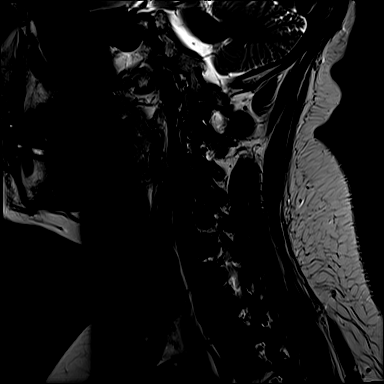
[im 8/15]
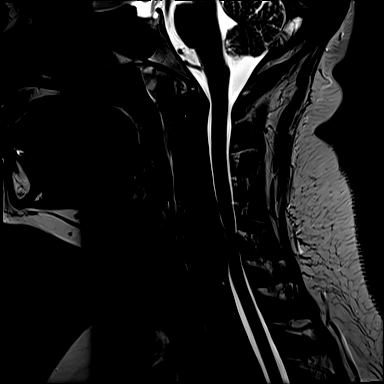
[im 10/15]
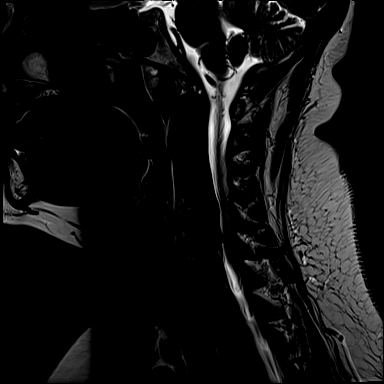
[im 12/15]
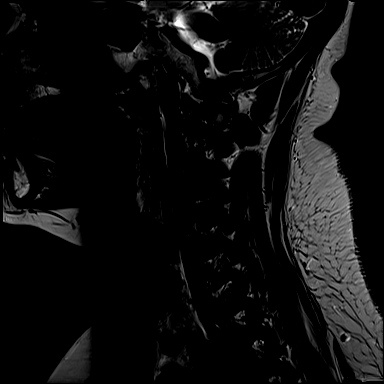
[im 15/15]
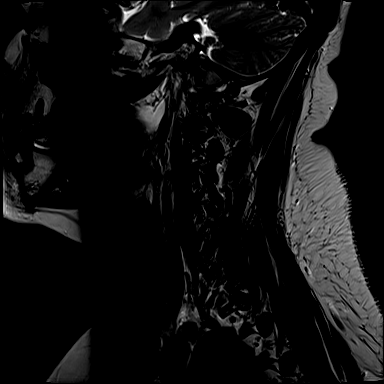

[Series 8: STIR · sagittal · 3.0mm · 0.33mm/px · 5 of 15 slices shown]
[im 1/15]
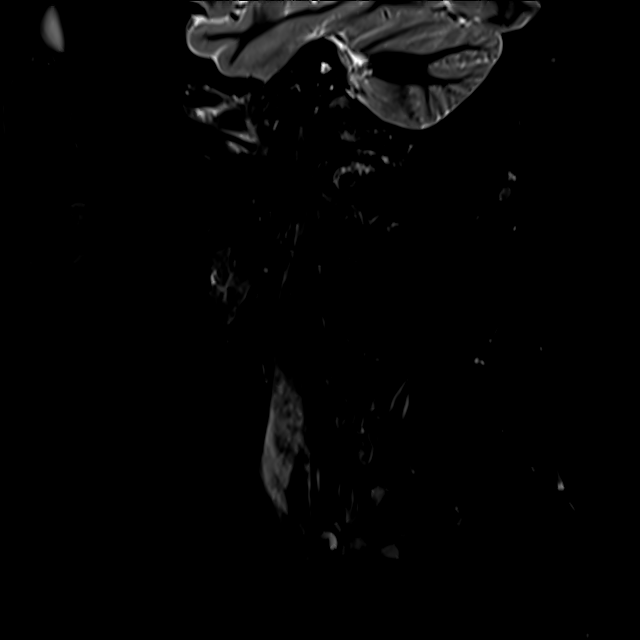
[im 3/15]
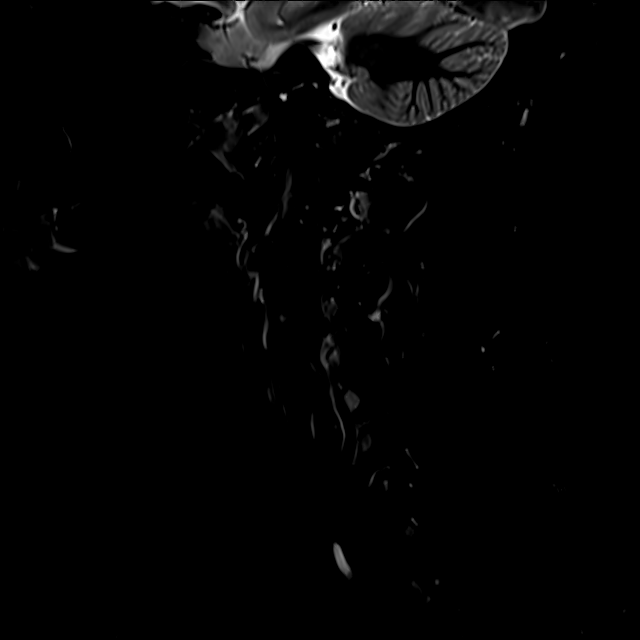
[im 5/15]
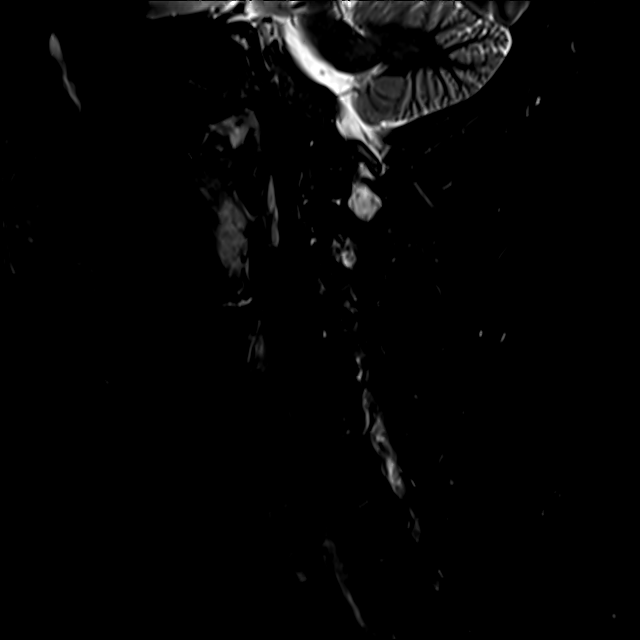
[im 8/15]
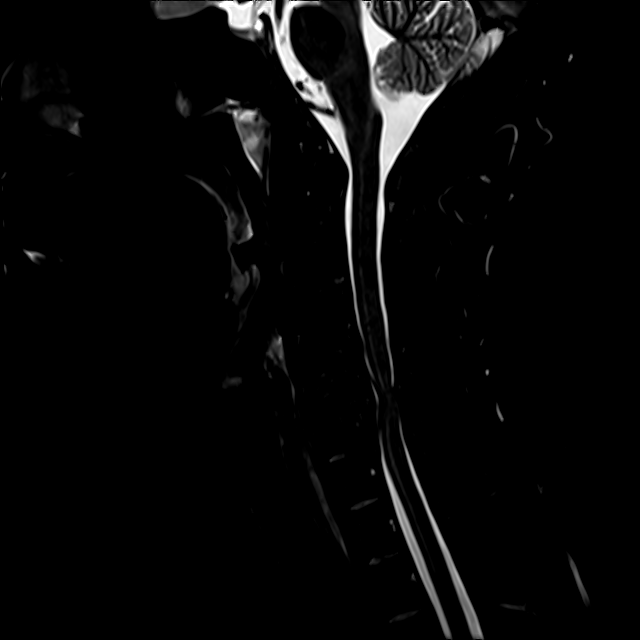
[im 12/15]
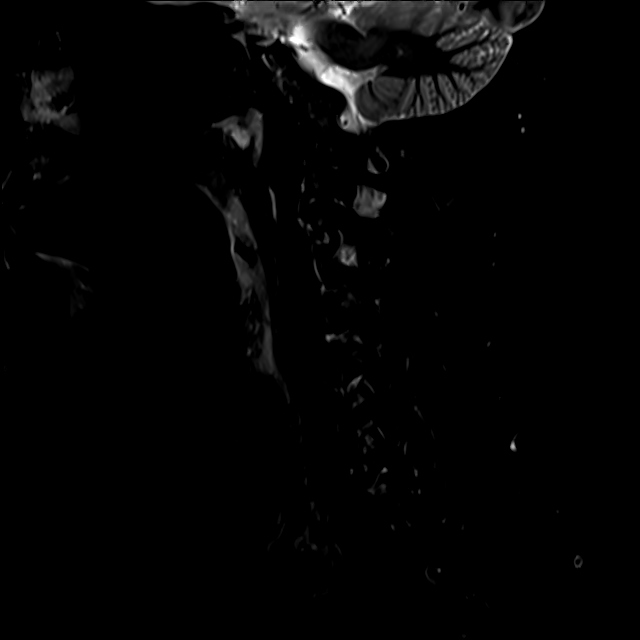

[Series 9: T2 · axial · 3.0mm · 0.50mm/px · z∈[-63,+34]mm · 8 of 31 slices shown (2 of 2)]
[im 1/31]
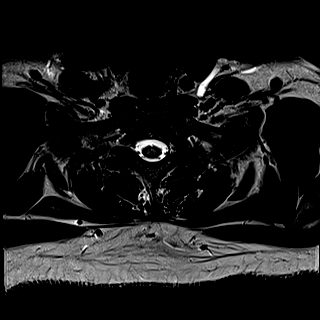
[im 5/31]
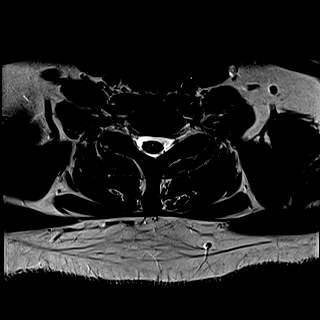
[im 10/31]
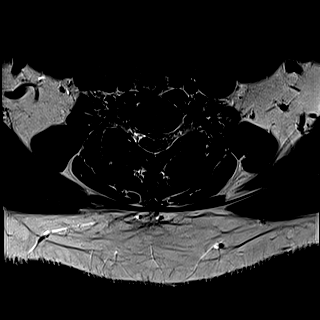
[im 14/31]
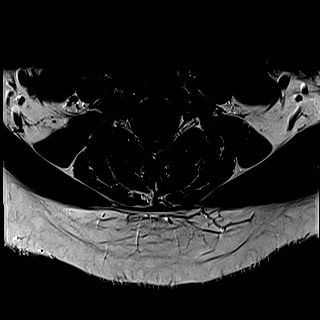
[im 17/31]
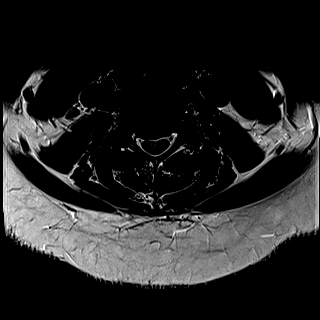
[im 21/31]
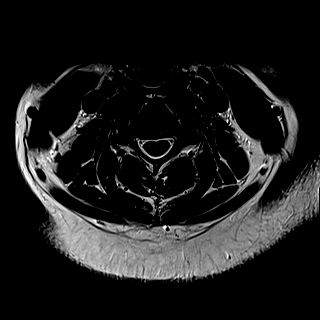
[im 26/31]
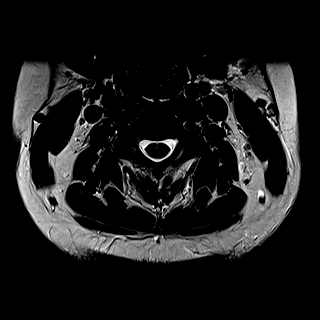
[im 31/31]
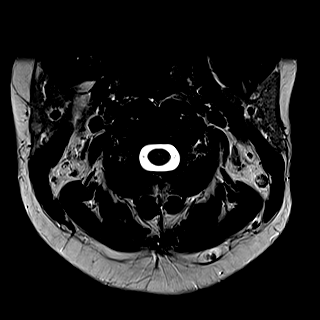

[26 of 48 positions shown; findings below may reference images not displayed]

FINDINGS: Alignment: Straightening of the normal cervical lordosis. No
listhesis.

Vertebrae: Vertebral body heights are maintained. No evidence for
acute or chronic fracture. Signal intensity within the vertebral
body bone marrow within normal limits. No abnormal marrow edema.

Cord: Subtle patchy T2 signal abnormality within the cervical spinal
cord at the level of C5-6, worrisome for edema and/or myelomalacia
of due to severe stenosis at this level (series 7, image 8). Signal
intensity within the cervical spinal cord is otherwise normal.

Posterior Fossa, vertebral arteries, paraspinal tissues: Visualized
portions of the brain and posterior fossa are unremarkable.
Craniocervical junction normal. Benign retention cyst noted within
the nasopharynx. Paraspinous and prevertebral soft tissues within
normal limits. Normal intravascular flow voids present within the
vertebral arteries bilaterally.

Disc levels:

C2-C3: Negative.

C3-C4: No significant disc bulge. Bilateral uncovertebral
hypertrophy. No significant canal stenosis. There is moderate
bilateral foraminal narrowing.

C4-C5: Diffuse degenerative disc bulging with bilateral
uncovertebral spurring. Left-sided facet disease. Central/right
paracentral disc bulge flattens and indents the ventral thecal sac
results in moderate canal stenosis. Severe bilateral foraminal
stenosis, right worse than left.

C5-C6: Prominent central disc protrusion is present (series 7, image
8). Probable associated extruded disc material superimposed on the
protruding disc with slight cephalad and caudad migration. There is
resultant severe canal stenosis with flattening of the cervical
spinal cord. Abnormal cord signal as above. Thecal sac measures
mm in AP diameter. Mild bilateral foraminal encroachment.

C6-C7: Small central disc protrusion indents the ventral thecal sac
with resultant mild canal stenosis. Mild bilateral uncovertebral
hypertrophy without significant foraminal stenosis.

C7-T1:  Negative.

Visualized upper thoracic spine unremarkable.
IMPRESSION: 1. Prominent central disc protrusion at C5-6 with resultant severe
canal stenosis. Patchy T2 signal abnormality within the cervical
cord at this level suggestive of associated edema and/ or
myelomalacia.
2. Degenerative spondylolysis at C4-5 with resultant moderate canal
and severe bilateral foraminal stenosis, right worse than left.
3. Moderate bilateral foraminal stenosis at C3-4 related to
uncovertebral disease.
4. Small central disc protrusion at C6-7.
These results will be called to the ordering clinician or
representative by the Radiologist Assistant, and communication
documented in the PACS or [employer] Dashboard.

## 2018-06-29 IMAGING — CR DG CERVICAL SPINE COMPLETE 4+V
1 series · 1 of 1 positions shown · non-contrast
Comparison: Cervical MRI 11/15/2015

CLINICAL DATA: ACDF C5-6

EXAM:
CERVICAL SPINE - COMPLETE 4+ VIEW

[AP]
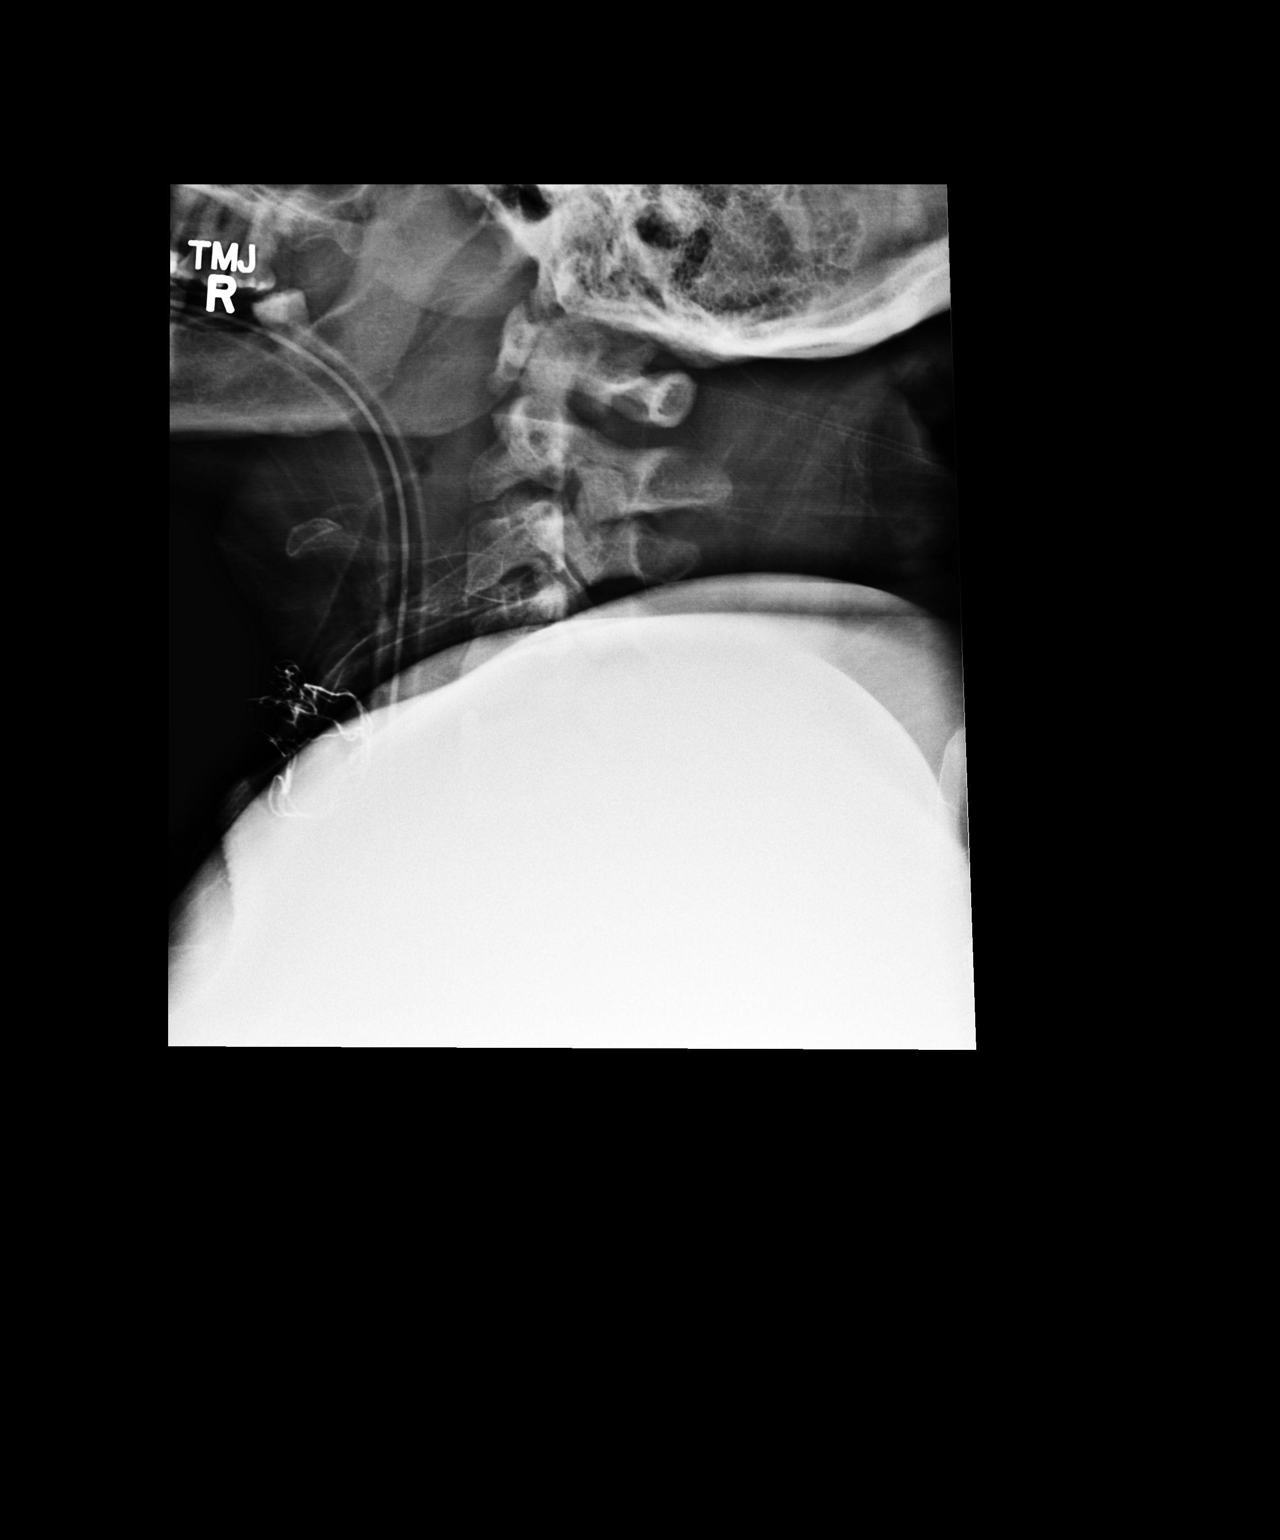

[1 of 1 positions shown; findings below may reference images not displayed]

FINDINGS: Needle localization of the C3-4 and C4-5 disc spaces.

The final image reveals a probable anterior plate at C5-6. This is
not well visualized due to overlying shoulders. Follow-up
radiographs suggested.
IMPRESSION: ACDF p[REDACTED] be at C5-6 but not well visualized. Follow-up x-rays
suggested.

## 2023-08-13 ENCOUNTER — Other Ambulatory Visit: Payer: Self-pay | Admitting: Nurse Practitioner

## 2023-08-13 DIAGNOSIS — Z1231 Encounter for screening mammogram for malignant neoplasm of breast: Secondary | ICD-10-CM

## 2023-09-09 ENCOUNTER — Ambulatory Visit
